# Patient Record
Sex: Male | Born: 1980 | Race: Black or African American | Hispanic: No | Marital: Single | Smoking: Never smoker
Health system: Southern US, Community
[De-identification: ages and names within clinical notes are randomized; demographics above are authoritative.]

## PROBLEM LIST (undated history)

## (undated) DIAGNOSIS — F3181 Bipolar II disorder: Secondary | ICD-10-CM

## (undated) DIAGNOSIS — K625 Hemorrhage of anus and rectum: Secondary | ICD-10-CM

## (undated) DIAGNOSIS — I10 Essential (primary) hypertension: Secondary | ICD-10-CM

## (undated) DIAGNOSIS — K635 Polyp of colon: Secondary | ICD-10-CM

## (undated) DIAGNOSIS — E785 Hyperlipidemia, unspecified: Secondary | ICD-10-CM

## (undated) DIAGNOSIS — N4889 Other specified disorders of penis: Secondary | ICD-10-CM

## (undated) HISTORY — PX: CIRCUMCISION: SUR203

---
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
## 2001-01-20 ENCOUNTER — Encounter: Admission: RE | Admit: 2001-01-20 | Discharge: 2001-01-20 | Payer: Self-pay | Admitting: Family Medicine

## 2001-04-08 ENCOUNTER — Encounter: Admission: RE | Admit: 2001-04-08 | Discharge: 2001-04-08 | Payer: Self-pay | Admitting: Family Medicine

## 2001-11-11 ENCOUNTER — Emergency Department (HOSPITAL_COMMUNITY): Admission: EM | Admit: 2001-11-11 | Discharge: 2001-11-11 | Payer: Self-pay | Admitting: Emergency Medicine

## 2001-11-16 ENCOUNTER — Encounter: Admission: RE | Admit: 2001-11-16 | Discharge: 2001-11-16 | Payer: Self-pay | Admitting: Family Medicine

## 2002-01-27 ENCOUNTER — Encounter: Admission: RE | Admit: 2002-01-27 | Discharge: 2002-01-27 | Payer: Self-pay | Admitting: Family Medicine

## 2002-04-12 ENCOUNTER — Encounter: Admission: RE | Admit: 2002-04-12 | Discharge: 2002-04-12 | Payer: Self-pay | Admitting: Family Medicine

## 2002-05-16 ENCOUNTER — Encounter: Admission: RE | Admit: 2002-05-16 | Discharge: 2002-05-16 | Payer: Self-pay | Admitting: Pediatrics

## 2002-06-17 ENCOUNTER — Emergency Department (HOSPITAL_COMMUNITY): Admission: EM | Admit: 2002-06-17 | Discharge: 2002-06-17 | Payer: Self-pay | Admitting: Emergency Medicine

## 2002-06-17 ENCOUNTER — Encounter: Payer: Self-pay | Admitting: Emergency Medicine

## 2003-02-03 ENCOUNTER — Encounter: Admission: RE | Admit: 2003-02-03 | Discharge: 2003-02-03 | Payer: Self-pay | Admitting: Family Medicine

## 2003-02-07 ENCOUNTER — Encounter: Admission: RE | Admit: 2003-02-07 | Discharge: 2003-02-07 | Payer: Self-pay | Admitting: Family Medicine

## 2003-02-09 ENCOUNTER — Encounter: Admission: RE | Admit: 2003-02-09 | Discharge: 2003-02-09 | Payer: Self-pay | Admitting: Family Medicine

## 2003-05-05 ENCOUNTER — Encounter: Admission: RE | Admit: 2003-05-05 | Discharge: 2003-05-05 | Payer: Self-pay | Admitting: Sports Medicine

## 2003-06-01 ENCOUNTER — Encounter: Admission: RE | Admit: 2003-06-01 | Discharge: 2003-06-01 | Payer: Self-pay | Admitting: Family Medicine

## 2004-02-26 ENCOUNTER — Encounter: Admission: RE | Admit: 2004-02-26 | Discharge: 2004-02-26 | Payer: Self-pay | Admitting: Family Medicine

## 2004-10-11 ENCOUNTER — Ambulatory Visit: Payer: Self-pay | Admitting: Family Medicine

## 2005-02-03 ENCOUNTER — Ambulatory Visit: Payer: Self-pay | Admitting: Family Medicine

## 2006-03-03 ENCOUNTER — Ambulatory Visit: Payer: Self-pay | Admitting: Family Medicine

## 2006-10-01 DIAGNOSIS — J309 Allergic rhinitis, unspecified: Secondary | ICD-10-CM | POA: Insufficient documentation

## 2006-10-01 DIAGNOSIS — F909 Attention-deficit hyperactivity disorder, unspecified type: Secondary | ICD-10-CM | POA: Insufficient documentation

## 2006-10-01 DIAGNOSIS — F79 Unspecified intellectual disabilities: Secondary | ICD-10-CM

## 2010-12-10 ENCOUNTER — Other Ambulatory Visit: Payer: Self-pay | Admitting: Nephrology

## 2010-12-10 ENCOUNTER — Ambulatory Visit
Admission: RE | Admit: 2010-12-10 | Discharge: 2010-12-10 | Disposition: A | Discharge status: Home / Self Care | Payer: Medicaid Other | Source: Ambulatory Visit | Attending: Nephrology | Admitting: Nephrology

## 2010-12-10 DIAGNOSIS — R05 Cough: Secondary | ICD-10-CM

## 2010-12-24 ENCOUNTER — Ambulatory Visit
Admission: RE | Admit: 2010-12-24 | Discharge: 2010-12-24 | Disposition: A | Discharge status: Home / Self Care | Payer: Medicaid Other | Source: Ambulatory Visit | Attending: Nephrology | Admitting: Nephrology

## 2010-12-24 ENCOUNTER — Other Ambulatory Visit: Payer: Self-pay | Admitting: Nephrology

## 2010-12-24 DIAGNOSIS — R05 Cough: Secondary | ICD-10-CM

## 2010-12-24 DIAGNOSIS — R059 Cough, unspecified: Secondary | ICD-10-CM

## 2011-08-05 HISTORY — PX: OTHER SURGICAL HISTORY: SHX169

## 2011-12-08 ENCOUNTER — Other Ambulatory Visit: Payer: Self-pay | Admitting: Urology

## 2011-12-12 ENCOUNTER — Encounter (HOSPITAL_BASED_OUTPATIENT_CLINIC_OR_DEPARTMENT_OTHER): Payer: Self-pay | Admitting: *Deleted

## 2011-12-12 NOTE — Progress Notes (Addendum)
SPOKE W/ PT MOTHER, TERRY. SHE IS LEGAL GUARDIAN, PT IS AUTISTIC AND BIPOLAR . MOTHER STATES THAT IF YOU ASK PT QUESTIONS, HE WILL ANSWER WITH "YES'S" AND HE IS NOT TO SIGN ANYTHING.  SHE SUGGEST THAT SHE BE WITH HIM ON ARRIVAL TO PRE-OP AND UPON WAKING IN PACU (MAY HAVE A BEHAVIOR ISSUE SINCE THINGS ARE UNFAMILIAR). NPO AFTER MN. ARRIVES AT 0730. NEEDS ISTAT.

## 2011-12-16 ENCOUNTER — Encounter (HOSPITAL_BASED_OUTPATIENT_CLINIC_OR_DEPARTMENT_OTHER): Payer: Self-pay | Admitting: *Deleted

## 2011-12-16 ENCOUNTER — Encounter (HOSPITAL_BASED_OUTPATIENT_CLINIC_OR_DEPARTMENT_OTHER): Payer: Self-pay | Admitting: Anesthesiology

## 2011-12-16 ENCOUNTER — Ambulatory Visit (HOSPITAL_BASED_OUTPATIENT_CLINIC_OR_DEPARTMENT_OTHER): Payer: Medicaid Other | Admitting: Anesthesiology

## 2011-12-16 ENCOUNTER — Encounter (HOSPITAL_BASED_OUTPATIENT_CLINIC_OR_DEPARTMENT_OTHER)
Admission: RE | Disposition: A | Discharge status: Home / Self Care | Payer: Self-pay | Source: Ambulatory Visit | Attending: Urology

## 2011-12-16 ENCOUNTER — Ambulatory Visit (HOSPITAL_BASED_OUTPATIENT_CLINIC_OR_DEPARTMENT_OTHER)
Admission: RE | Admit: 2011-12-16 | Discharge: 2011-12-16 | Disposition: A | Discharge status: Home / Self Care | Payer: Medicaid Other | Source: Ambulatory Visit | Attending: Urology | Admitting: Urology

## 2011-12-16 DIAGNOSIS — F840 Autistic disorder: Secondary | ICD-10-CM | POA: Insufficient documentation

## 2011-12-16 DIAGNOSIS — L723 Sebaceous cyst: Secondary | ICD-10-CM | POA: Insufficient documentation

## 2011-12-16 DIAGNOSIS — E7800 Pure hypercholesterolemia, unspecified: Secondary | ICD-10-CM | POA: Insufficient documentation

## 2011-12-16 DIAGNOSIS — I10 Essential (primary) hypertension: Secondary | ICD-10-CM | POA: Insufficient documentation

## 2011-12-16 DIAGNOSIS — Z79899 Other long term (current) drug therapy: Secondary | ICD-10-CM | POA: Insufficient documentation

## 2011-12-16 HISTORY — DX: Essential (primary) hypertension: I10

## 2011-12-16 HISTORY — DX: Hyperlipidemia, unspecified: E78.5

## 2011-12-16 HISTORY — DX: Bipolar II disorder: F31.81

## 2011-12-16 HISTORY — DX: Other specified disorders of penis: N48.89

## 2011-12-16 LAB — POCT I-STAT 4, (NA,K, GLUC, HGB,HCT)
Hemoglobin: 14.6 g/dL (ref 13.0–17.0)
Potassium: 4.1 mEq/L (ref 3.5–5.1)

## 2011-12-16 SURGERY — BIOPSY, PENIS
Anesthesia: General | Site: Penis | Wound class: Clean

## 2011-12-16 MED ORDER — MEPERIDINE HCL 25 MG/ML IJ SOLN: 6.2500 mg | INTRAMUSCULAR | Status: DC | PRN

## 2011-12-16 MED ORDER — LACTATED RINGERS IV SOLN: INTRAVENOUS | Status: DC

## 2011-12-16 MED ORDER — LIDOCAINE HCL (CARDIAC) 20 MG/ML IV SOLN
INTRAVENOUS | Status: DC | PRN
  Administered 2011-12-16: 80 mg via INTRAVENOUS

## 2011-12-16 MED ORDER — FENTANYL CITRATE 0.05 MG/ML IJ SOLN
INTRAMUSCULAR | Status: DC | PRN
  Administered 2011-12-16 (×2): 50 ug via INTRAVENOUS

## 2011-12-16 MED ORDER — DEXAMETHASONE SODIUM PHOSPHATE 4 MG/ML IJ SOLN
INTRAMUSCULAR | Status: DC | PRN
  Administered 2011-12-16: 8 mg via INTRAVENOUS

## 2011-12-16 MED ORDER — PROPOFOL 10 MG/ML IV EMUL
INTRAVENOUS | Status: DC | PRN
  Administered 2011-12-16: 200 mg via INTRAVENOUS

## 2011-12-16 MED ORDER — HYDROCODONE-ACETAMINOPHEN 5-500 MG PO CAPS: 1.0000 | ORAL_CAPSULE | Freq: Four times a day (QID) | ORAL | Status: AC | PRN

## 2011-12-16 MED ORDER — ATROPINE SULFATE 0.4 MG/ML IJ SOLN
INTRAMUSCULAR | Status: DC | PRN
  Administered 2011-12-16: 0.4 mg via INTRAVENOUS

## 2011-12-16 MED ORDER — MINERAL OIL LIGHT 100 % EX OIL
TOPICAL_OIL | CUTANEOUS | Status: DC | PRN
  Administered 2011-12-16: 1 via TOPICAL

## 2011-12-16 MED ORDER — LACTATED RINGERS IV SOLN
INTRAVENOUS | Status: DC
  Administered 2011-12-16: 10:00:00 via INTRAVENOUS
  Administered 2011-12-16: 100 mL/h via INTRAVENOUS

## 2011-12-16 MED ORDER — PROMETHAZINE HCL 25 MG/ML IJ SOLN: 6.2500 mg | INTRAMUSCULAR | Status: DC | PRN

## 2011-12-16 MED ORDER — LABETALOL HCL 5 MG/ML IV SOLN
5.0000 mg | INTRAVENOUS | Status: DC | PRN
  Administered 2011-12-16: 5 mg via INTRAVENOUS

## 2011-12-16 MED ORDER — ONDANSETRON HCL 4 MG/2ML IJ SOLN
INTRAMUSCULAR | Status: DC | PRN
  Administered 2011-12-16: 4 mg via INTRAVENOUS

## 2011-12-16 MED ORDER — FENTANYL CITRATE 0.05 MG/ML IJ SOLN: 25.0000 ug | INTRAMUSCULAR | Status: DC | PRN

## 2011-12-16 MED ORDER — BUPIVACAINE HCL (PF) 0.25 % IJ SOLN
INTRAMUSCULAR | Status: DC | PRN
  Administered 2011-12-16: 4 mL

## 2011-12-16 SURGICAL SUPPLY — 26 items
BANDAGE CO FLEX L/F 1IN X 5YD (GAUZE/BANDAGES/DRESSINGS) IMPLANT
BANDAGE CONFORM 2  STR LF (GAUZE/BANDAGES/DRESSINGS) ×1 IMPLANT
BLADE SURG 15 STRL LF DISP TIS (BLADE) ×1 IMPLANT
BLADE SURG 15 STRL SS (BLADE) ×2
BNDG COHESIVE 1X5 TAN STRL LF (GAUZE/BANDAGES/DRESSINGS) ×1 IMPLANT
CLOTH BEACON ORANGE TIMEOUT ST (SAFETY) ×2 IMPLANT
COVER MAYO STAND STRL (DRAPES) ×2 IMPLANT
COVER TABLE BACK 60X90 (DRAPES) ×2 IMPLANT
DRAPE PED LAPAROTOMY (DRAPES) ×2 IMPLANT
ELECT REM PT RETURN 9FT ADLT (ELECTROSURGICAL) ×2
ELECTRODE REM PT RTRN 9FT ADLT (ELECTROSURGICAL) ×1 IMPLANT
GAUZE VASELINE 1X8 (GAUZE/BANDAGES/DRESSINGS) ×1 IMPLANT
GLOVE BIO SURGEON STRL SZ7 (GLOVE) ×2 IMPLANT
GLOVE ECLIPSE 7.0 STRL STRAW (GLOVE) ×1 IMPLANT
GLOVE INDICATOR 7.0 STRL GRN (GLOVE) ×1 IMPLANT
GOWN PREVENTION PLUS LG XLONG (DISPOSABLE) ×2 IMPLANT
NDL HYPO 25X1 1.5 SAFETY (NEEDLE) ×1 IMPLANT
NEEDLE HYPO 25X1 1.5 SAFETY (NEEDLE) ×2 IMPLANT
NS IRRIG 500ML POUR BTL (IV SOLUTION) ×1 IMPLANT
PACK BASIN DAY SURGERY FS (CUSTOM PROCEDURE TRAY) ×2 IMPLANT
PENCIL BUTTON HOLSTER BLD 10FT (ELECTRODE) ×2 IMPLANT
SUT CHROMIC 4 0 P 3 18 (SUTURE) ×1 IMPLANT
SUT CHROMIC 5 0 RB 1 27 (SUTURE) IMPLANT
SYR CONTROL 10ML LL (SYRINGE) ×2 IMPLANT
TOWEL OR 17X24 6PK STRL BLUE (TOWEL DISPOSABLE) ×3 IMPLANT
TRAY DSU PREP LF (CUSTOM PROCEDURE TRAY) ×2 IMPLANT

## 2011-12-16 NOTE — H&P (Signed)
History of Present Illness  Matthew Gross is seen at Dr Janey Greaser request.  He was found on physical examination to have a penile cyst.  His mother noticed it about 2 months ago.  It is increasing in size.   It is not associated with any pain or discomfort.  He voids well.  He is autistic.   Past Medical History Problems  1. History of  Anxiety (Symptom) 300.00 2. History of  Depression 311 3. History of  Hypercholesterolemia 272.0 4. History of  Hypertension 401.9  Surgical History Problems  1. History of  Elective Circumcision V50.2  Current Meds 1. Lipitor 10 MG Oral Tablet; Therapy: (Recorded:06May2013) to 2. Mavik 2 MG Oral Tablet; Therapy: (Recorded:06May2013) to 3. Neurontin 800 MG Oral Tablet; Therapy: (Recorded:06May2013) to 4. RisperDAL M-TAB 1 MG Oral Tablet Dispersible; Therapy: (Recorded:06May2013) to 5. Wellbutrin XL 300 MG Oral Tablet Extended Release 24 Hour; Therapy: (Recorded:06May2013) to 6. Zoloft TABS; Therapy: (Recorded:06May2013) to  Allergies Medication  1. Penicillins  Family History Problems  1. Family history of  Bladder Cancer V16.52 2. Family history of  Blood In Urine 3. Maternal history of  Diabetes Mellitus V18.0 4. Family history of  Family Health Status - Mother's Age 80 5. Family history of  Father Deceased At Age ____ 6. Family history of  Prostate Cancer V16.42  Social History Problems  1. Marital History - Single 2. Never A Smoker Denied  3. History of  Alcohol Use 4. History of  Caffeine Use  Review of Systems Genitourinary, constitutional, skin, eye, otolaryngeal, hematologic/lymphatic, cardiovascular, pulmonary, endocrine, musculoskeletal, gastrointestinal, neurological and psychiatric system(s) were reviewed and pertinent findings if present are noted.  Genitourinary: nocturia.  Gastrointestinal: nausea and diarrhea.  Constitutional: fever.  Psychiatric: anxiety and depression.    Vitals Vital Signs [Data Includes: Last 1 Day]    06May2013 09:33AM  BMI Calculated: 27.3 BSA Calculated: 2.18 Height: 6 ft 1 in Weight: 206 lb  Blood Pressure: 142 / 82 Heart Rate: 84 Respiration: 18  Physical Exam Constitutional: Well nourished and well developed . No acute distress.  ENT:. The ears and nose are normal in appearance.  Neck: The appearance of the neck is normal and no neck mass is present.  Pulmonary: No respiratory distress and normal respiratory rhythm and effort.  Cardiovascular: Heart rate and rhythm are normal . No peripheral edema.  Abdomen: The abdomen is soft and nontender. No masses are palpated. No CVA tenderness. No hernias are palpable. No hepatosplenomegaly noted.  Genitourinary: Examination of the penis demonstrates swelling. The penis is circumcised.  There is a 2 cm mass on the right side of the penis proximal to the coronal sulcus.  The meatus is normal.  Lymphatics: The femoral and inguinal nodes are not enlarged or tender.  Skin: Normal skin turgor, no visible rash and no visible skin lesions.  Neuro/Psych:. Mood and affect are appropriate.    Results/Data Urine [Data Includes: Last 1 Day]   06May2013  COLOR YELLOW   APPEARANCE CLEAR   SPECIFIC GRAVITY 1.020   pH 5.5   GLUCOSE NEG mg/dL  BILIRUBIN NEG   KETONE NEG mg/dL  BLOOD NEG   PROTEIN NEG mg/dL  UROBILINOGEN 0.2 mg/dL  NITRITE NEG   LEUKOCYTE ESTERASE NEG    Assessment Assessed  1. Penile Lesion  Plan Health Maintenance (V70.0)  1. UA With REFLEX  Done: 06May2013 09:13AM   His mother would like to have it excised.  It needs to be done under general anesthesia.  The  procedure, risks, benefits were explained to his mother.  The risks include but are not limited to hemorrhage, infection, recurrence.  She understands and wishes to proceed.   Signatures  CC: Dr Jeri Cos  Amended By: Su Grand; 12/08/2011 9:48 AMEST  Electronically signed by : Su Grand, M.D.; Dec 08 2011  9:48AM

## 2011-12-16 NOTE — Op Note (Signed)
Matthew Gross is a 31 y.o.   12/16/2011  Pre-op diagnosis: Penile cysts  Postop diagnosis: Same  Procedure done: Excision biopsy of penile cysts  Surgeon: Wendie Simmer. Senica Crall  Anesthesia: General  Indication: Patient is a 31 years old autistic male who was found by his mother to have a penile cyst. He was sent to our office for evaluation and management by Dr. Bascom Levels. 2 inclusion cysts were found on the foreskin 1 cm proximal to the coronal sulcus. His mother requested that those cysts be excised. He is scheduled today for the procedure.  Procedure: Patient was identified by his wrist band and proper timeout was taken.  Under general anesthesia he was prepped and draped and placed in the supine position. The cysts were on the dorsal aspect of the penis. The penile skin was infiltrated with 0.25% Marcaine. An eleptical incision was made around the right cyst and the cyst was excised. Hemostasis was secured with electrocautery. Skin approximation was then done with #4-0 chromic. The same procedure was carried out on the other cyst. Sterile  dressing was then applied to the wound  EBL: Minimal  Needles, sponges count: Correct  The patient tolerated the procedure well and left the OR in satisfactory condition to postanesthesia care unit.

## 2011-12-16 NOTE — Transfer of Care (Signed)
Immediate Anesthesia Transfer of Care Note  Patient: Matthew Gross  Procedure(s) Performed: Procedure(s) (LRB): PENILE BIOPSY (N/A)  Patient Location: PACU  Anesthesia Type: General  Level of Consciousness: awake, oriented, sedated and patient cooperative  Airway & Oxygen Therapy: Patient Spontanous Breathing and Patient connected to face mask oxygen  Post-op Assessment: Report given to PACU RN and Post -op Vital signs reviewed and stable  Post vital signs: Reviewed and stable  Complications: No apparent anesthesia complications

## 2011-12-16 NOTE — Discharge Instructions (Signed)

## 2011-12-16 NOTE — Anesthesia Procedure Notes (Signed)
Procedure Name: LMA Insertion Date/Time: 12/16/2011 9:00 AM Performed by: Renella Cunas D Pre-anesthesia Checklist: Patient identified, Emergency Drugs available, Suction available and Patient being monitored Patient Re-evaluated:Patient Re-evaluated prior to inductionOxygen Delivery Method: Circle System Utilized Preoxygenation: Pre-oxygenation with 100% oxygen Intubation Type: IV induction Ventilation: Mask ventilation without difficulty LMA: LMA inserted LMA Size: 4.0 Number of attempts: 1 Airway Equipment and Method: bite block Placement Confirmation: positive ETCO2 Tube secured with: Tape Dental Injury: Teeth and Oropharynx as per pre-operative assessment

## 2011-12-16 NOTE — Anesthesia Preprocedure Evaluation (Addendum)
Anesthesia Evaluation  Patient identified by MRN, date of birth, ID band Patient awake    Reviewed: Allergy & Precautions, H&P , NPO status , Patient's Chart, lab work & pertinent test results  Airway Mallampati: II TM Distance: >3 FB Neck ROM: Full    Dental No notable dental hx.    Pulmonary neg pulmonary ROS,  breath sounds clear to auscultation  Pulmonary exam normal       Cardiovascular hypertension, negative cardio ROS  Rhythm:Regular Rate:Normal     Neuro/Psych PSYCHIATRIC DISORDERS Bipolar Disorder Mental retardation, autism. High functioningnegative neurological ROS  negative psych ROS   GI/Hepatic negative GI ROS, Neg liver ROS,   Endo/Other  negative endocrine ROS  Renal/GU negative Renal ROS  negative genitourinary   Musculoskeletal negative musculoskeletal ROS (+)   Abdominal   Peds negative pediatric ROS (+)  Hematology negative hematology ROS (+)   Anesthesia Other Findings   Reproductive/Obstetrics negative OB ROS                         Anesthesia Physical Anesthesia Plan  ASA: II  Anesthesia Plan: General   Post-op Pain Management:    Induction: Intravenous  Airway Management Planned: LMA  Additional Equipment:   Intra-op Plan:   Post-operative Plan: Extubation in OR  Informed Consent: I have reviewed the patients History and Physical, chart, labs and discussed the procedure including the risks, benefits and alternatives for the proposed anesthesia with the patient or authorized representative who has indicated his/her understanding and acceptance.   Dental advisory given  Plan Discussed with: CRNA  Anesthesia Plan Comments:         Anesthesia Quick Evaluation

## 2011-12-16 NOTE — Progress Notes (Signed)
Dr. Acey Lav at bedside.  States if mean >100 give 10mg  labetalol

## 2011-12-16 NOTE — Progress Notes (Signed)
bp elevated.  Hr 108.  Pt denies pain,  Dr. Acey Lav informed , at bedside.  Order received for labetalol

## 2011-12-16 NOTE — Anesthesia Postprocedure Evaluation (Signed)
  Anesthesia Post-op Note  Patient: Matthew Gross  Procedure(s) Performed: Procedure(s) (LRB): PENILE BIOPSY (N/A)  Patient Location: PACU  Anesthesia Type: General  Level of Consciousness: awake and alert   Airway and Oxygen Therapy: Patient Spontanous Breathing  Post-op Pain: mild  Post-op Assessment: Post-op Vital signs reviewed, Patient's Cardiovascular Status Stable, Respiratory Function Stable, Patent Airway and No signs of Nausea or vomiting  Post-op Vital Signs: stable  Complications: No apparent anesthesia complications

## 2011-12-23 ENCOUNTER — Encounter (HOSPITAL_BASED_OUTPATIENT_CLINIC_OR_DEPARTMENT_OTHER): Payer: Self-pay

## 2012-08-18 IMAGING — CR DG CHEST 2V
2 series · 2 of 2 positions shown · non-contrast
Comparison: None.

CLINICAL DATA: Cough, congestion for 2-week

CHEST - 2 VIEW

[view not recorded (1 of 2)]
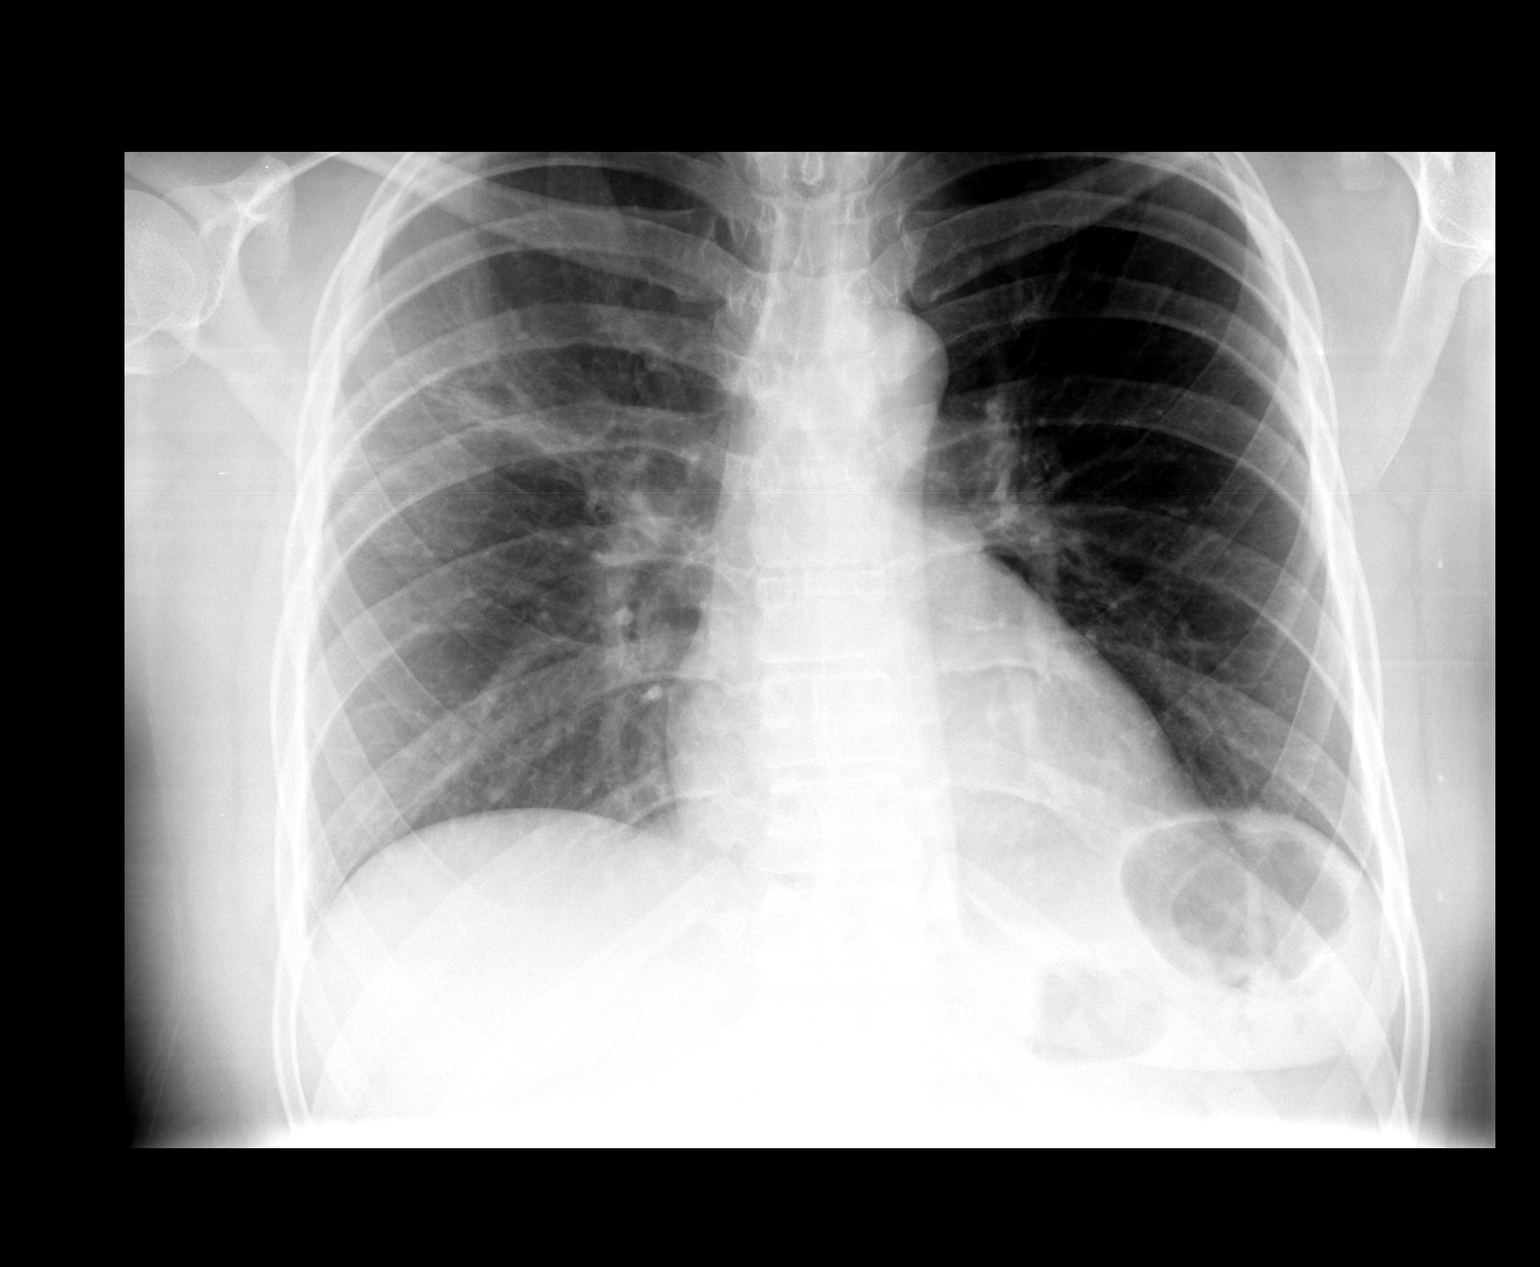

[view not recorded (2 of 2)]
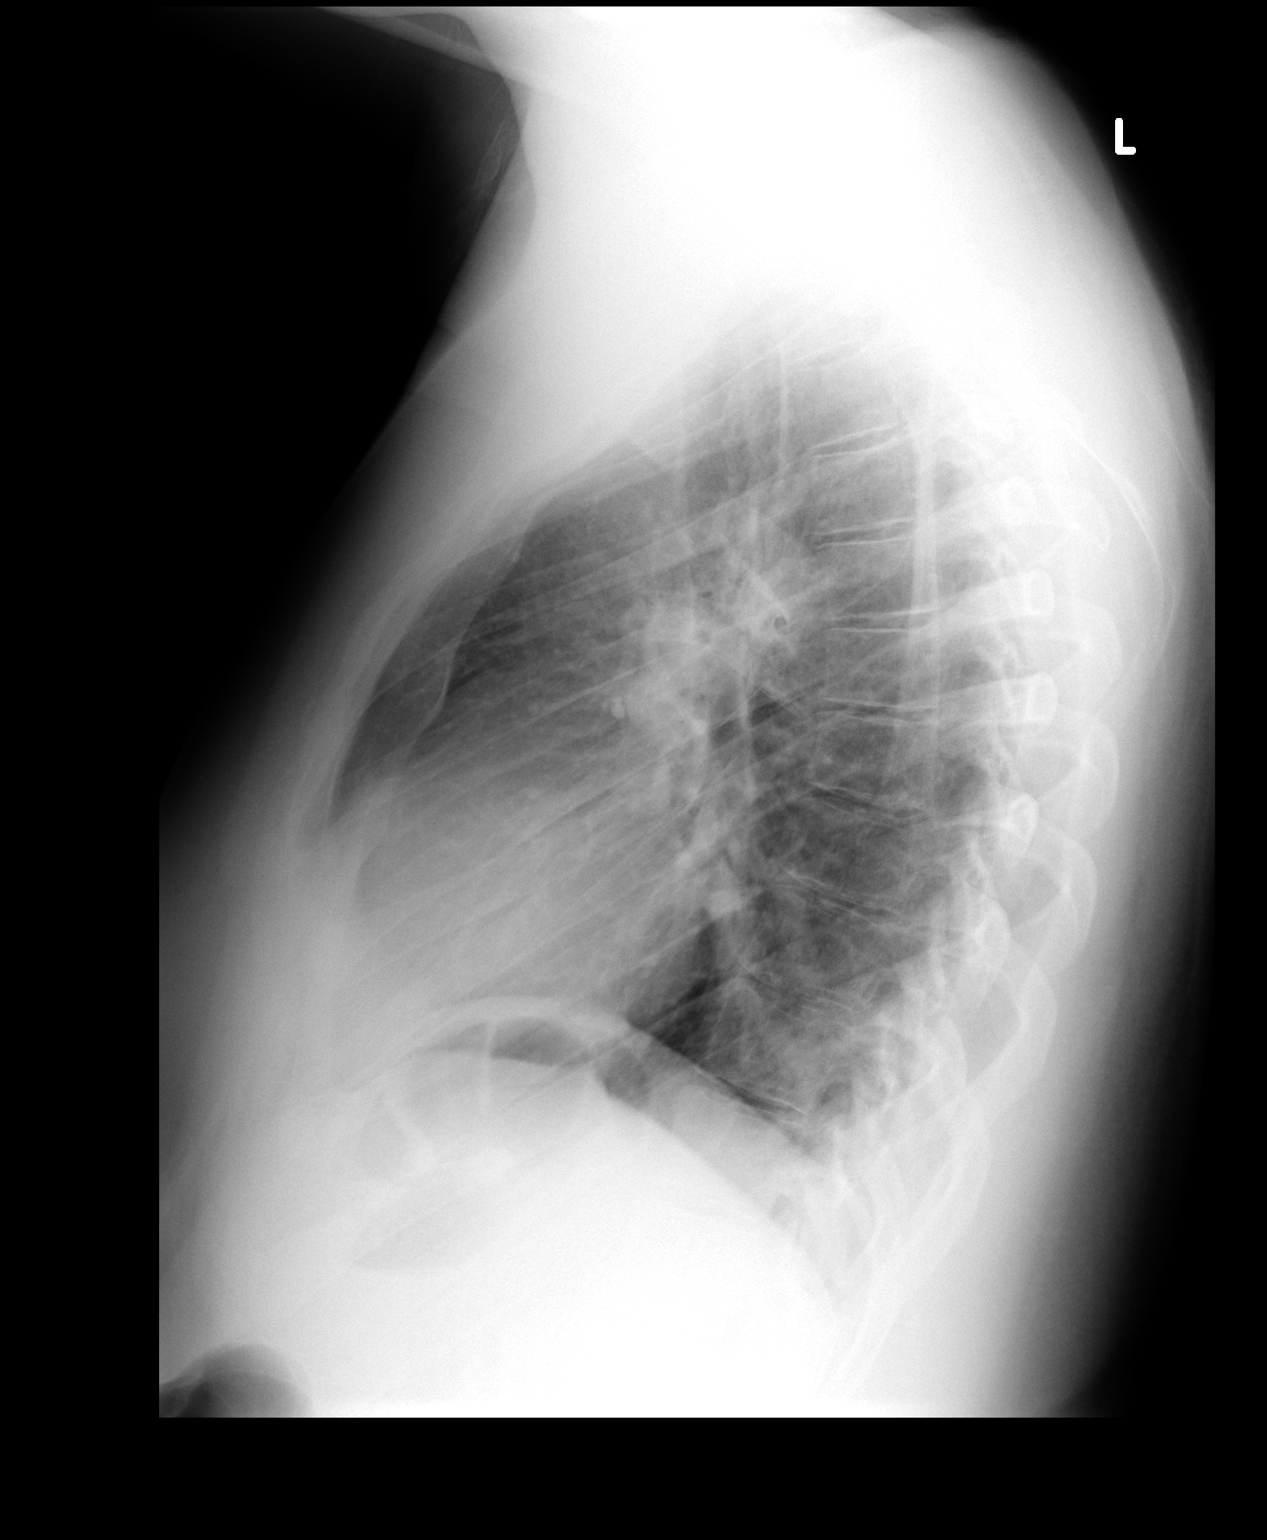

[2 of 2 positions shown; findings below may reference images not displayed]

FINDINGS: There is parenchymal opacity in the right mid upper lung
field which could represent atelectasis or patchy residual
pneumonia.  Peribronchial thickening is present consistent with
bronchitis.  Mediastinal contours are normal.  The heart is within
normal limits in size.  No bony abnormality is seen.
IMPRESSION: 1.  Parenchymal opacity in the right mid upper lung field may
represent atelectasis or residual pneumonia.
2.  Peribronchial thickening consistent with bronchitis.

## 2012-11-09 ENCOUNTER — Emergency Department (HOSPITAL_COMMUNITY)
Admission: EM | Admit: 2012-11-09 | Discharge: 2012-11-09 | Disposition: A | Discharge status: Home / Self Care | Payer: Medicaid Other | Source: Home / Self Care | Attending: Family Medicine | Admitting: Family Medicine

## 2012-11-09 ENCOUNTER — Encounter (HOSPITAL_COMMUNITY): Payer: Self-pay | Admitting: *Deleted

## 2012-11-09 DIAGNOSIS — E782 Mixed hyperlipidemia: Secondary | ICD-10-CM | POA: Diagnosis present

## 2012-11-09 DIAGNOSIS — I10 Essential (primary) hypertension: Secondary | ICD-10-CM | POA: Diagnosis present

## 2012-11-09 DIAGNOSIS — F3181 Bipolar II disorder: Secondary | ICD-10-CM | POA: Diagnosis present

## 2012-11-09 DIAGNOSIS — F840 Autistic disorder: Secondary | ICD-10-CM | POA: Diagnosis present

## 2012-11-09 DIAGNOSIS — E785 Hyperlipidemia, unspecified: Secondary | ICD-10-CM

## 2012-11-09 LAB — COMPREHENSIVE METABOLIC PANEL
ALT: 23 U/L (ref 0–53)
AST: 21 U/L (ref 0–37)
Albumin: 4.3 g/dL (ref 3.5–5.2)
CO2: 25 mEq/L (ref 19–32)
Calcium: 10.1 mg/dL (ref 8.4–10.5)
Creatinine, Ser: 1.05 mg/dL (ref 0.50–1.35)
GFR calc non Af Amer: >90 mL/min (ref >90)
Sodium: 140 mEq/L (ref 135–145)

## 2012-11-09 LAB — CBC
HCT: 41.9 % (ref 39.0–52.0)
Hemoglobin: 15.1 g/dL (ref 13.0–17.0)
MCH: 28.8 pg (ref 26.0–34.0)
MCHC: 36 g/dL (ref 30.0–36.0)
MCV: 79.8 fL (ref 78.0–100.0)
Platelets: 177 10*3/uL (ref 150–400)
RBC: 5.25 MIL/uL (ref 4.22–5.81)
RDW: 12.7 % (ref 11.5–15.5)
WBC: 6 10*3/uL (ref 4.0–10.5)

## 2012-11-09 LAB — LIPID PANEL
Cholesterol: 139 mg/dL (ref 0–200)
HDL: 56 mg/dL (ref >39)
LDL Cholesterol: 67 mg/dL (ref 0–99)
Total CHOL/HDL Ratio: 2.5 RATIO
Triglycerides: 79 mg/dL (ref <150)
VLDL: 16 mg/dL (ref 0–40)

## 2012-11-09 MED ORDER — TRANDOLAPRIL 2 MG PO TABS: 2.0000 mg | ORAL_TABLET | Freq: Every morning | ORAL | Status: DC

## 2012-11-09 MED ORDER — ATORVASTATIN CALCIUM 10 MG PO TABS: 10.0000 mg | ORAL_TABLET | Freq: Every day | ORAL | Status: DC

## 2012-11-09 NOTE — ED Notes (Signed)
Present to establish care with MD

## 2012-11-09 NOTE — ED Provider Notes (Signed)
History   CSN: [id number]  Arrival date & time 11/09/12  1030   First MD Initiated Contact with Patient 11/09/12 1052      No chief complaint on file.   (Consider location/radiation/quality/duration/timing/severity/associated sxs/prior treatment) HPI Pt presenting as new patient today for med refills.  He has been stable per mother and reports that he goes to behavioral health and they manage most of his medications.  Past Medical History  Diagnosis Date  . Depressed bipolar II disorder   . Autistic disorder PT GOES TO A DAY PROGRAM DAILY AND HAS ASSISTANCE AT HOME IN THE EVERY    PT MOTHER LEGAL GAUDERIAN  . Penile cyst   . Hypertension   . Hyperlipidemia     Past Surgical History  Procedure Laterality Date  . Circumcision  1990'S    No family history on file.  History  Substance Use Topics  . Smoking status: Never Smoker   . Smokeless tobacco: Never Used  . Alcohol Use: No    Review of Systems Constitutional: Negative.  HENT: Negative.  Respiratory: Negative.  Cardiovascular: Negative.  Gastrointestinal: Negative.  Endocrine: Negative.  Genitourinary: Negative.  Musculoskeletal: Negative.  Skin: Negative.  Allergic/Immunologic: Negative.  Neurological: Negative.  Hematological: Negative.  Psychiatric/Behavioral: Negative.  All other systems reviewed and are negative   Allergies  Penicillins  Home Medications   Current Outpatient Rx  Name  Route  Sig  Dispense  Refill  . atorvastatin (LIPITOR) 10 MG tablet   Oral   Take 10 mg by mouth every morning.         Marland Kitchen buPROPion (WELLBUTRIN XL) 300 MG 24 hr tablet   Oral   Take 300 mg by mouth every morning.         . Gabapentin (NEURONTIN PO)   Oral   Take 1,200 mg by mouth every evening. PT TAKES 1 1/2 OF 800 MG TABLET         . RisperiDONE (RISPERDAL PO)   Oral   Take 50 mg by mouth 2 (two) times daily. PT TAKES 1/2 100MG  TABLET         . Sertraline HCl (ZOLOFT PO)   Oral   Take 150 mg  by mouth every morning. PT TAKES 1 1/2 100MG  TABLET         . trandolapril (MAVIK) 2 MG tablet   Oral   Take 2 mg by mouth every morning.           BP 110/76  Pulse 68  Temp(Src) 98.4 F (36.9 C) (Oral)  Resp 18  SpO2 99%  Physical Exam Nursing note and vitals reviewed.  Constitutional: He is oriented to person, place, and time. He appears well-developed and well-nourished. No distress.  Eyes: Conjunctivae and EOM are normal. Pupils are equal, round, and reactive to light.  Neck: Normal range of motion. Neck supple. No JVD present. No thyromegaly present.  Cardiovascular: Normal rate, regular rhythm and normal heart sounds.  No murmur heard.  Pulmonary/Chest: Effort normal and breath sounds normal. No respiratory distress.  Abdominal: Soft. Bowel sounds are normal.  Musculoskeletal: Normal range of motion. He exhibits no edema.  Lymphadenopathy:  He has no cervical adenopathy.  Neurological: He is oriented to person, place, and time. Coordination normal.  Skin: Skin is warm and dry. No rash noted. No erythema. No pallor.  Psychiatric: He has a normal mood and affect. His behavior is normal. Judgment and thought content normal.    ED Course  Procedures (including critical  care time)  Labs Reviewed - No data to display No results found.   No diagnosis found.  MDM  IMPRESSION  Hypertension  Hyperlipidemia  Bipolar II disorder  Autistic Disorder  RECOMMENDATIONS / PLAN Refilled medications today Sent for old medical records Check labs today  FOLLOW UP 2 months   The patient was given clear instructions to go to ER or return to medical center if symptoms don't improve, worsen or new problems develop.  The patient verbalized understanding.  The patient was told to call to get lab results if they haven't heard anything in the next week.            Cleora Fleet, MD 11/09/12 1144

## 2012-11-10 ENCOUNTER — Telehealth (HOSPITAL_COMMUNITY): Payer: Self-pay

## 2012-11-10 LAB — VITAMIN D 25 HYDROXY (VIT D DEFICIENCY, FRACTURES): Vit D, 25-Hydroxy: 32 ng/mL (ref 30–89)

## 2012-11-10 NOTE — Progress Notes (Signed)
Quick Note:  Please inform guardian that all labs came back OK.   Rodney Langton, MD, CDE, FAAFP [facility] [place], [place]   ______

## 2013-02-07 ENCOUNTER — Encounter (HOSPITAL_COMMUNITY): Payer: Self-pay | Admitting: Emergency Medicine

## 2013-02-07 ENCOUNTER — Emergency Department (HOSPITAL_COMMUNITY)
Admission: EM | Admit: 2013-02-07 | Discharge: 2013-02-07 | Disposition: A | Discharge status: Home / Self Care | Payer: Medicaid Other | Source: Home / Self Care

## 2013-02-07 DIAGNOSIS — H9209 Otalgia, unspecified ear: Secondary | ICD-10-CM

## 2013-02-07 DIAGNOSIS — H9202 Otalgia, left ear: Secondary | ICD-10-CM

## 2013-02-07 DIAGNOSIS — J309 Allergic rhinitis, unspecified: Secondary | ICD-10-CM

## 2013-02-07 MED ORDER — FLUTICASONE PROPIONATE 50 MCG/ACT NA SUSP: 2.0000 | Freq: Every day | NASAL | Status: DC

## 2013-02-07 MED ORDER — FEXOFENADINE-PSEUDOEPHED ER 60-120 MG PO TB12: 1.0000 | ORAL_TABLET | Freq: Two times a day (BID) | ORAL | Status: DC

## 2013-02-07 MED ORDER — NEOMYCIN-POLYMYXIN-HC 3.5-10000-1 OT SUSP: 4.0000 [drp] | Freq: Three times a day (TID) | OTIC | Status: DC

## 2013-02-07 NOTE — ED Notes (Signed)
Pts legal guardian c/o left ear pain since last night. Sudden onset. Denies fever or drainage. Patient is alert and oriented.

## 2013-02-07 NOTE — ED Provider Notes (Signed)
Medical screening examination/treatment/procedure(s) were performed by non-physician practitioner and as supervising physician I was immediately available for consultation/collaboration.  Leslee Home, M.D.   Reuben Likes, MD 02/07/13 [phone]

## 2013-02-07 NOTE — ED Provider Notes (Signed)
History    CSN: [id number] Arrival date & time 02/07/13  0947  None    Chief Complaint  Patient presents with  . Otalgia   (Consider location/radiation/quality/duration/timing/severity/associated sxs/prior Treatment) HPI  32 yo male presents today with the above complaint.  Hx obtained from legal guardian.  Left ear pain started yesterday, but he has been having some problems with runny nose, nasal congestion, itchy/watery eyes for 2-3 days.  No complaints of fever, chills, headaches, cough, cp, sob, sore throat.  No trouble with hearing.   Past Medical History  Diagnosis Date  . Depressed bipolar II disorder   . Autistic disorder PT GOES TO A DAY PROGRAM DAILY AND HAS ASSISTANCE AT HOME IN THE EVERY    PT MOTHER LEGAL GAUDERIAN  . Penile cyst   . Hypertension   . Hyperlipidemia    Past Surgical History  Procedure Laterality Date  . Circumcision  1990'S  . Cyst removal from penis  2013    Dr. Brunilda Payor    Family History  Problem Relation Age of Onset  . Heart failure Mother   . Hypertension Mother   . Hyperlipidemia Mother    History  Substance Use Topics  . Smoking status: Never Smoker   . Smokeless tobacco: Never Used  . Alcohol Use: No    Review of Systems  Constitutional: Negative.   HENT: Positive for ear pain and congestion. Negative for hearing loss and ear discharge.   Eyes: Positive for itching. Negative for photophobia, redness and visual disturbance.  Respiratory: Negative.   Cardiovascular: Negative.   Gastrointestinal: Negative.   Endocrine: Negative.   Genitourinary: Negative.   Neurological: Negative.   Psychiatric/Behavioral: Negative.     Allergies  Penicillins  Home Medications   Current Outpatient Rx  Name  Route  Sig  Dispense  Refill  . atorvastatin (LIPITOR) 10 MG tablet   Oral   Take 1 tablet (10 mg total) by mouth daily.   30 tablet   3   . buPROPion (WELLBUTRIN XL) 300 MG 24 hr tablet   Oral   Take 300 mg by mouth every  morning.         . Gabapentin (NEURONTIN PO)   Oral   Take 1,200 mg by mouth every evening. PT TAKES 1 1/2 OF 800 MG TABLET         . RisperiDONE (RISPERDAL PO)   Oral   Take 50 mg by mouth 2 (two) times daily. PT TAKES 1/2 100MG  TABLET         . Sertraline HCl (ZOLOFT PO)   Oral   Take 150 mg by mouth every morning. PT TAKES 1 1/2 100MG  TABLET         . trandolapril (MAVIK) 2 MG tablet   Oral   Take 1 tablet (2 mg total) by mouth every morning.   30 tablet   3   . fexofenadine-pseudoephedrine (ALLEGRA-D) 60-120 MG per tablet   Oral   Take 1 tablet by mouth every 12 (twelve) hours.   30 tablet   0   . fluticasone (FLONASE) 50 MCG/ACT nasal spray   Nasal   Place 2 sprays into the nose daily.   16 g   2   . neomycin-polymyxin-hydrocortisone (CORTISPORIN) 3.5-10000-1 otic suspension   Left Ear   Place 4 drops into the left ear 3 (three) times daily.   10 mL   1    BP 135/86  Pulse 75  Temp(Src) 98.8 F (37.1 C) (  Oral)  Resp 14  SpO2 97% Physical Exam  Constitutional: He appears well-developed and well-nourished.  HENT:  Head: Normocephalic and atraumatic.  Right Ear: External ear normal.  Nose: Nose normal.  Mouth/Throat: Oropharynx is clear and moist. No oropharyngeal exudate.  Left ear canal very tender with otoscope exam.  TM looks good.  Canal is red with some swelling.  No drainage.    Neck: Normal range of motion.  Cardiovascular: Normal rate and regular rhythm.   Pulmonary/Chest: Effort normal and breath sounds normal.  Abdominal: Soft.  Musculoskeletal: Normal range of motion.  Lymphadenopathy:    He has no cervical adenopathy.  Neurological: He is alert.  Skin: Skin is warm and dry.    ED Course  Procedures (including critical care time) Labs Reviewed - No data to display No results found. 1. Otalgia of left ear   2. Allergic rhinitis     MDM  Patient and legal guardian advised to return to clinic in 3-5 days if symptoms not  resolved or sooner if needed.  Voice understanding.  Went over instructions  Meds ordered this encounter  Medications  . fexofenadine-pseudoephedrine (ALLEGRA-D) 60-120 MG per tablet    Sig: Take 1 tablet by mouth every 12 (twelve) hours.    Dispense:  30 tablet    Refill:  0  . fluticasone (FLONASE) 50 MCG/ACT nasal spray    Sig: Place 2 sprays into the nose daily.    Dispense:  16 g    Refill:  2  . neomycin-polymyxin-hydrocortisone (CORTISPORIN) 3.5-10000-1 otic suspension    Sig: Place 4 drops into the left ear 3 (three) times daily.    Dispense:  10 mL    Refill:  1    Zonia Kief, PA-C 02/07/13 1056

## 2013-02-07 NOTE — Discharge Instructions (Signed)
Allergic Rhinitis Allergic rhinitis is when the mucous membranes in the nose respond to allergens. Allergens are particles in the air that cause your body to have an allergic reaction. This causes you to release allergic antibodies. Through a chain of events, these eventually cause you to release histamine into the blood stream (hence the use of antihistamines). Although meant to be protective to the body, it is this release that causes your discomfort, such as frequent sneezing, congestion and an itchy runny nose.  CAUSES  The pollen allergens may come from grasses, trees, and weeds. This is seasonal allergic rhinitis, or "hay fever." Other allergens cause year-round allergic rhinitis (perennial allergic rhinitis) such as house dust mite allergen, pet dander and mold spores.  SYMPTOMS   Nasal stuffiness (congestion).  Runny, itchy nose with sneezing and tearing of the eyes.  There is often an itching of the mouth, eyes and ears. It cannot be cured, but it can be controlled with medications. DIAGNOSIS  If you are unable to determine the offending allergen, skin or blood testing may find it. TREATMENT   Avoid the allergen.  Medications and allergy shots (immunotherapy) can help.  Hay fever may often be treated with antihistamines in pill or nasal spray forms. Antihistamines block the effects of histamine. There are over-the-counter medicines that may help with nasal congestion and swelling around the eyes. Check with your caregiver before taking or giving this medicine. If the treatment above does not work, there are many new medications your caregiver can prescribe. Stronger medications may be used if initial measures are ineffective. Desensitizing injections can be used if medications and avoidance fails. Desensitization is when a patient is given ongoing shots until the body becomes less sensitive to the allergen. Make sure you follow up with your caregiver if problems continue. SEEK MEDICAL  CARE IF:   You develop fever (more than 100.5 F (38.1 C).  You develop a cough that does not stop easily (persistent).  You have shortness of breath.  You start wheezing.  Symptoms interfere with normal daily activities. Document Released: 04/15/2001 Document Revised: 10/13/2011 Document Reviewed: 10/25/2008 [facility] Patient Information 2014 [place], [place].  Otalgia Otalgia is pain in or around the ear. When the pain is from the ear itself it is called primary otalgia. Pain may also be coming from somewhere else, like the head and neck. This is called secondary otalgia.  CAUSES  Causes of primary otalgia include:  Middle ear infection.  It can also be caused by injury to the ear or infection of the ear canal (swimmer's ear). Swimmer's ear causes pain, swelling and often drainage from the ear canal. Causes of secondary otalgia include:  Sinus infections.  Allergies and colds that cause stuffiness of the nose and tubes that drain the ears (eustachian tubes).  Dental problems like cavities, gum infections or teething.  Sore Throat (tonsillitis and pharyngitis).  Swollen glands in the neck.  Infection of the bone behind the ear (mastoiditis).  TMJ discomfort (problems with the joint between your jaw and your skull).  Other problems such as nerve disorders, circulation problems, heart disease and tumors of the head and neck can also cause symptoms of ear pain. This is rare. DIAGNOSIS  Evaluation, Diagnosis and Testing:  Examination by your medical caregiver is recommended to evaluate and diagnose the cause of otalgia.  Further testing or referral to a specialist may be indicated if the cause of the ear pain is not found and the symptom persists. TREATMENT   Your  doctor may prescribe antibiotics if an ear infection is diagnosed.  Pain relievers and topical analgesics may be recommended.  It is important to take all medications as prescribed. HOME CARE INSTRUCTIONS    It may be helpful to sleep with the painful ear in the up position.  A warm compress over the painful ear may provide relief.  A soft diet and avoiding gum may help while ear pain is present. SEEK IMMEDIATE MEDICAL CARE IF:  You develop severe pain, a high fever, repeated vomiting or dehydration.  You develop extreme dizziness, headache, confusion, ringing in the ears (tinnitus) or hearing loss. Document Released: 08/28/2004 Document Revised: 10/13/2011 Document Reviewed: 05/30/2009 [facility] Patient Information 2014 [place], [place].

## 2014-10-26 ENCOUNTER — Ambulatory Visit
Admission: RE | Admit: 2014-10-26 | Discharge: 2014-10-26 | Disposition: A | Discharge status: Home / Self Care | Payer: Medicaid Other | Source: Ambulatory Visit | Attending: Family | Admitting: Family

## 2014-10-26 ENCOUNTER — Other Ambulatory Visit: Payer: Self-pay | Admitting: Family

## 2014-10-26 DIAGNOSIS — R05 Cough: Secondary | ICD-10-CM

## 2014-10-26 DIAGNOSIS — R059 Cough, unspecified: Secondary | ICD-10-CM

## 2015-08-05 DIAGNOSIS — K625 Hemorrhage of anus and rectum: Secondary | ICD-10-CM

## 2015-08-05 HISTORY — DX: Hemorrhage of anus and rectum: K62.5

## 2015-08-05 HISTORY — PX: OTHER SURGICAL HISTORY: SHX169

## 2015-11-27 ENCOUNTER — Encounter (HOSPITAL_COMMUNITY): Payer: Self-pay | Admitting: Emergency Medicine

## 2015-11-27 ENCOUNTER — Emergency Department (HOSPITAL_COMMUNITY): Payer: Medicaid Other

## 2015-11-27 ENCOUNTER — Emergency Department (HOSPITAL_COMMUNITY)
Admission: EM | Admit: 2015-11-27 | Discharge: 2015-11-27 | Disposition: A | Discharge status: Home / Self Care | Payer: Medicaid Other | Attending: Emergency Medicine | Admitting: Emergency Medicine

## 2015-11-27 DIAGNOSIS — Z880 Allergy status to penicillin: Secondary | ICD-10-CM | POA: Insufficient documentation

## 2015-11-27 DIAGNOSIS — Z79899 Other long term (current) drug therapy: Secondary | ICD-10-CM | POA: Insufficient documentation

## 2015-11-27 DIAGNOSIS — R05 Cough: Secondary | ICD-10-CM | POA: Insufficient documentation

## 2015-11-27 DIAGNOSIS — F840 Autistic disorder: Secondary | ICD-10-CM | POA: Diagnosis not present

## 2015-11-27 DIAGNOSIS — E785 Hyperlipidemia, unspecified: Secondary | ICD-10-CM | POA: Diagnosis not present

## 2015-11-27 DIAGNOSIS — F3181 Bipolar II disorder: Secondary | ICD-10-CM | POA: Insufficient documentation

## 2015-11-27 DIAGNOSIS — I10 Essential (primary) hypertension: Secondary | ICD-10-CM | POA: Diagnosis not present

## 2015-11-27 DIAGNOSIS — Z7951 Long term (current) use of inhaled steroids: Secondary | ICD-10-CM | POA: Insufficient documentation

## 2015-11-27 DIAGNOSIS — R059 Cough, unspecified: Secondary | ICD-10-CM

## 2015-11-27 MED ORDER — BENZONATATE 100 MG PO CAPS: 100.0000 mg | ORAL_CAPSULE | Freq: Three times a day (TID) | ORAL | Status: DC

## 2015-11-27 MED ORDER — BENZONATATE 100 MG PO CAPS
100.0000 mg | ORAL_CAPSULE | Freq: Once | ORAL | Status: AC
  Administered 2015-11-27: 100 mg via ORAL
  Filled 2015-11-27: qty 1

## 2015-11-27 NOTE — ED Notes (Signed)
PT IS AUTISTIC/BIPOLAR  Mother reports pt has had productive cough x 2 days. Thick, yellow sputum. States he goes to a daycare and 'picks up everything'.

## 2015-11-27 NOTE — Discharge Instructions (Signed)
1. Medications: tessalon, usual home medications 2. Treatment: rest, drink plenty of fluids; try warm honey, tea, throat lozenges for additional symptom relief 3. Follow Up: please followup with your primary doctor for discussion of your diagnoses and further evaluation after today's visit; if you do not have a primary care doctor use the phone number listed in your discharge paperwork to find one; please return to the ER for high fever, chest pain, shortness of breath, new or worsening symptoms   Cough, Adult A cough helps to clear your throat and lungs. A cough may last only 2-3 weeks (acute), or it may last longer than 8 weeks (chronic). Many different things can cause a cough. A cough may be a sign of an illness or another medical condition. HOME CARE  Pay attention to any changes in your cough.  Take medicines only as told by your doctor.  If you were prescribed an antibiotic medicine, take it as told by your doctor. Do not stop taking it even if you start to feel better.  Talk with your doctor before you try using a cough medicine.  Drink enough fluid to keep your pee (urine) clear or pale yellow.  If the air is dry, use a cold steam vaporizer or humidifier in your home.  Stay away from things that make you cough at work or at home.  If your cough is worse at night, try using extra pillows to raise your head up higher while you sleep.  Do not smoke, and try not to be around smoke. If you need help quitting, ask your doctor.  Do not have caffeine.  Do not drink alcohol.  Rest as needed. GET HELP IF:  You have new problems (symptoms).  You cough up yellow fluid (pus).  Your cough does not get better after 2-3 weeks, or your cough gets worse.  Medicine does not help your cough and you are not sleeping well.  You have pain that gets worse or pain that is not helped with medicine.  You have a fever.  You are losing weight and you do not know why.  You have night  sweats. GET HELP RIGHT AWAY IF:  You cough up blood.  You have trouble breathing.  Your heartbeat is very fast.   This information is not intended to replace advice given to you by your health care provider. Make sure you discuss any questions you have with your health care provider.   Document Released: 04/03/2011 Document Revised: 04/11/2015 Document Reviewed: 09/27/2014 Elsevier Interactive Patient Education [employer].

## 2015-11-27 NOTE — ED Provider Notes (Signed)
CSN: [id number]     Arrival date & time 11/27/15  [id number] History   First MD Initiated Contact with Patient 11/27/15 [phone]     No chief complaint on file.   HPI   Matthew Gross is a 35 y.o. male with a PMH of autism, depression, HTN, HLD who presents to the ED with cough productive of yellow green mucous for the past 2 days. He denies exacerbating factors. He has been taking over-the-counter cough medicine with no significant symptom relief. Mom is present at bedside, and reports fever at home. The patient denies chest pain, shortness of breath, sick contact, though notes he is in a daycare program.   Past Medical History  Diagnosis Date  . Depressed bipolar II disorder ([place])   . Autistic disorder PT GOES TO A DAY PROGRAM DAILY AND HAS ASSISTANCE AT HOME IN THE EVERY    PT MOTHER LEGAL [place]  . Penile cyst   . Hypertension   . Hyperlipidemia    Past Surgical History  Procedure Laterality Date  . Circumcision  1990'S  . Cyst removal from penis  2013    Dr. Janice Norrie    Family History  Problem Relation Age of Onset  . Heart failure Mother   . Hypertension Mother   . Hyperlipidemia Mother    Social History  Substance Use Topics  . Smoking status: Never Smoker   . Smokeless tobacco: Never Used  . Alcohol Use: No      Review of Systems  Constitutional: Positive for fever. Negative for chills.  HENT: Negative for congestion and sore throat.   Respiratory: Positive for cough. Negative for shortness of breath.   Cardiovascular: Negative for chest pain.  All other systems reviewed and are negative.     Allergies  Penicillins  Home Medications   Prior to Admission medications   Medication Sig Start Date End Date Taking? Authorizing Provider  atorvastatin (LIPITOR) 10 MG tablet Take 1 tablet (10 mg total) by mouth daily. 11/09/12   Clanford Marisa Hua, MD  benzonatate (TESSALON) 100 MG capsule Take 1 capsule (100 mg total) by mouth every 8 (eight) hours. 11/27/15   Marella Chimes, PA-C  buPROPion (WELLBUTRIN XL) 300 MG 24 hr tablet Take 300 mg by mouth every morning.    Historical Provider, MD  fexofenadine-pseudoephedrine (ALLEGRA-D) 60-120 MG per tablet Take 1 tablet by mouth every 12 (twelve) hours. 02/07/13   Lanae Crumbly, PA-C  fluticasone (FLONASE) 50 MCG/ACT nasal spray Place 2 sprays into the nose daily. 02/07/13   Lanae Crumbly, PA-C  Gabapentin (NEURONTIN PO) Take 1,200 mg by mouth every evening. PT TAKES 1 1/2 OF 800 MG TABLET    Historical Provider, MD  neomycin-polymyxin-hydrocortisone (CORTISPORIN) 3.5-10000-1 otic suspension Place 4 drops into the left ear 3 (three) times daily. 02/07/13   Lanae Crumbly, PA-C  RisperiDONE (RISPERDAL PO) Take 50 mg by mouth 2 (two) times daily. PT TAKES 1/2 100MG  TABLET    Historical Provider, MD  Sertraline HCl (ZOLOFT PO) Take 150 mg by mouth every morning. PT TAKES 1 1/2 100MG  TABLET    Historical Provider, MD  trandolapril (MAVIK) 2 MG tablet Take 1 tablet (2 mg total) by mouth every morning. 11/09/12   Clanford L Johnson, MD    BP 137/82 mmHg  Pulse 74  Temp(Src) 98.6 F (37 C) (Oral)  Resp 14  SpO2 98% Physical Exam  Constitutional: He is oriented to person, place, and time. He appears well-developed and well-nourished.  No distress.  HENT:  Head: Normocephalic and atraumatic.  Right Ear: Hearing, tympanic membrane, external ear and ear canal normal.  Left Ear: Hearing, tympanic membrane, external ear and ear canal normal.  Nose: Nose normal.  Mouth/Throat: Uvula is midline, oropharynx is clear and moist and mucous membranes are normal.  Eyes: Conjunctivae, EOM and lids are normal. Pupils are equal, round, and reactive to light. Right eye exhibits no discharge. Left eye exhibits no discharge. No scleral icterus.  Neck: Normal range of motion. Neck supple.  Cardiovascular: Normal rate, regular rhythm, normal heart sounds, intact distal pulses and normal pulses.   Pulmonary/Chest: Effort normal and breath sounds  normal. No respiratory distress. He has no wheezes. He has no rales.  Abdominal: Soft. Normal appearance and bowel sounds are normal. He exhibits no distension and no mass. There is no tenderness. There is no rigidity, no rebound and no guarding.  Musculoskeletal: Normal range of motion. He exhibits no edema or tenderness.  Neurological: He is alert and oriented to person, place, and time.  Skin: Skin is warm, dry and intact. No rash noted. He is not diaphoretic. No erythema. No pallor.  Psychiatric: He has a normal mood and affect. His speech is normal and behavior is normal.  Nursing note and vitals reviewed.   ED Course  Procedures (including critical care time)  Labs Review Labs Reviewed - No data to display  Imaging Review Dg Chest 2 View  11/27/2015  CLINICAL DATA:  Upper respiratory infection symptoms for the past 3 days ; history of pneumonia in 2016, nonsmoker. EXAM: CHEST  2 VIEW COMPARISON:  PA and lateral chest x-ray of October 26, 2014 FINDINGS: The lungs are adequately inflated and clear. The heart and pulmonary vascularity are normal. The mediastinum is normal in width. There is no pleural effusion. The bony thorax is unremarkable. IMPRESSION: There is no active cardiopulmonary disease. Electronically Signed   By: David  Martinique M.D.   On: 11/27/2015 10:13   I have personally reviewed and evaluated these images as part of my medical decision-making.   EKG Interpretation None      MDM   Final diagnoses:  Cough    35 year old male presents with cough productive of thick yellow sputum for the past 2 days. Mom reports fever at home. Patient denies chest pain or shortness of breath. Patient is afebrile. Vital signs stable. Posterior oropharynx without erythema, edema, or exudate. Heart regular rate and rhythm. Lungs clear to auscultation bilaterally.   Will obtain chest x-ray and give cough medicine.   Chest x-ray negative for active cardiopulmonary disease. Discussed  findings with patient and mom. Patient is nontoxic and well-appearing, feel he is stable for discharge at this time. Symptoms likely viral. Will give cough medicine for home. Patient to follow-up with PCP. Strict return precautions discussed. Patient and mom verbalize their understanding and are in agreement with plan.  BP 137/82 mmHg  Pulse 74  Temp(Src) 98.6 F (37 C) (Oral)  Resp 14  SpO2 98%     Marella Chimes, PA-C 11/27/15 1023  Virgel Manifold, MD 12/05/15 1244

## 2015-12-05 ENCOUNTER — Other Ambulatory Visit: Payer: Self-pay | Admitting: Gastroenterology

## 2015-12-21 ENCOUNTER — Emergency Department (HOSPITAL_COMMUNITY)
Admission: EM | Admit: 2015-12-21 | Discharge: 2015-12-21 | Disposition: A | Discharge status: Home / Self Care | Payer: Medicaid Other | Attending: Emergency Medicine | Admitting: Emergency Medicine

## 2015-12-21 ENCOUNTER — Encounter (HOSPITAL_COMMUNITY): Payer: Self-pay | Admitting: Emergency Medicine

## 2015-12-21 DIAGNOSIS — Z87438 Personal history of other diseases of male genital organs: Secondary | ICD-10-CM | POA: Insufficient documentation

## 2015-12-21 DIAGNOSIS — F840 Autistic disorder: Secondary | ICD-10-CM | POA: Diagnosis not present

## 2015-12-21 DIAGNOSIS — Z880 Allergy status to penicillin: Secondary | ICD-10-CM | POA: Insufficient documentation

## 2015-12-21 DIAGNOSIS — F3181 Bipolar II disorder: Secondary | ICD-10-CM | POA: Diagnosis not present

## 2015-12-21 DIAGNOSIS — I10 Essential (primary) hypertension: Secondary | ICD-10-CM | POA: Diagnosis not present

## 2015-12-21 DIAGNOSIS — Z79899 Other long term (current) drug therapy: Secondary | ICD-10-CM | POA: Insufficient documentation

## 2015-12-21 DIAGNOSIS — E785 Hyperlipidemia, unspecified: Secondary | ICD-10-CM | POA: Diagnosis not present

## 2015-12-21 DIAGNOSIS — K921 Melena: Secondary | ICD-10-CM | POA: Insufficient documentation

## 2015-12-21 DIAGNOSIS — K625 Hemorrhage of anus and rectum: Secondary | ICD-10-CM | POA: Diagnosis present

## 2015-12-21 LAB — CBC
HCT: 39.6 % (ref 39.0–52.0)
Hemoglobin: 13.4 g/dL (ref 13.0–17.0)
MCH: 28.2 pg (ref 26.0–34.0)
MCHC: 33.8 g/dL (ref 30.0–36.0)
MCV: 83.4 fL (ref 78.0–100.0)
PLATELETS: 187 10*3/uL (ref 150–400)
RBC: 4.75 MIL/uL (ref 4.22–5.81)
RDW: 12.9 % (ref 11.5–15.5)
WBC: 5.5 10*3/uL (ref 4.0–10.5)

## 2015-12-21 LAB — COMPREHENSIVE METABOLIC PANEL
ALK PHOS: 62 U/L (ref 38–126)
ALT: 32 U/L (ref 17–63)
AST: 25 U/L (ref 15–41)
Albumin: 3.9 g/dL (ref 3.5–5.0)
Anion gap: 5 (ref 5–15)
BILIRUBIN TOTAL: 0.7 mg/dL (ref 0.3–1.2)
BUN: 7 mg/dL (ref 6–20)
CALCIUM: 9.2 mg/dL (ref 8.9–10.3)
CO2: 27 mmol/L (ref 22–32)
CREATININE: 1.19 mg/dL (ref 0.61–1.24)
Chloride: 105 mmol/L (ref 101–111)
GFR calc Af Amer: >60 mL/min (ref >60)
Glucose, Bld: 93 mg/dL (ref 65–99)
Potassium: 3.9 mmol/L (ref 3.5–5.1)
Sodium: 137 mmol/L (ref 135–145)
TOTAL PROTEIN: 7.1 g/dL (ref 6.5–8.1)

## 2015-12-21 LAB — TYPE AND SCREEN
ABO/RH(D): O POS
ANTIBODY SCREEN: NEGATIVE

## 2015-12-21 LAB — POC OCCULT BLOOD, ED: Fecal Occult Bld: POSITIVE — AB

## 2015-12-21 LAB — ABO/RH: ABO/RH(D): O POS

## 2015-12-21 NOTE — ED Provider Notes (Signed)
CSN: [id number]     Arrival date & time 12/21/15  1312 History   First MD Initiated Contact with Patient 12/21/15 1551     Chief Complaint  Patient presents with  . Rectal Bleeding     (Consider location/radiation/quality/duration/timing/severity/associated sxs/prior Treatment) Patient is a 35 y.o. male presenting with hematochezia. The history is provided by the patient, a parent and medical records. No language interpreter was used.  Rectal Bleeding Quality:  Bright red Amount:  Moderate Duration:  1 day Timing:  Intermittent Progression:  Partially resolved Context comment:  S/p colonoscopy for rectal bleeding 12/05/15, per family benign poly was removed. Patient had 2 episodes of formed stool mixed with BRB today Similar prior episodes: yes   Relieved by:  None tried Worsened by:  Defecation Ineffective treatments:  None tried Associated symptoms: no abdominal pain, no dizziness, no fever, no hematemesis, no loss of consciousness, no recent illness and no vomiting   Risk factors: no anticoagulant use     Past Medical History  Diagnosis Date  . Depressed bipolar II disorder ([place])   . Autistic disorder PT GOES TO A DAY PROGRAM DAILY AND HAS ASSISTANCE AT HOME IN THE EVERY    PT MOTHER LEGAL [place]  . Penile cyst   . Hypertension   . Hyperlipidemia    Past Surgical History  Procedure Laterality Date  . Circumcision  1990'S  . Cyst removal from penis  2013    Dr. Janice Norrie    Family History  Problem Relation Age of Onset  . Heart failure Mother   . Hypertension Mother   . Hyperlipidemia Mother    Social History  Substance Use Topics  . Smoking status: Never Smoker   . Smokeless tobacco: Never Used  . Alcohol Use: No    Review of Systems  Constitutional: Negative for fever and chills.  HENT: Negative for congestion and rhinorrhea.   Eyes: Negative for visual disturbance.  Respiratory: Negative for cough and shortness of breath.   Cardiovascular: Negative for  chest pain and palpitations.  Gastrointestinal: Positive for blood in stool and hematochezia. Negative for vomiting, abdominal pain, diarrhea, constipation and hematemesis.  Genitourinary: Negative for dysuria and difficulty urinating.  Musculoskeletal: Negative for myalgias and back pain.  Skin: Negative for pallor and rash.  Neurological: Negative for dizziness, loss of consciousness and headaches.  Psychiatric/Behavioral: Negative for confusion.      Allergies  Penicillins  Home Medications   Prior to Admission medications   Medication Sig Start Date End Date Taking? Authorizing Provider  atorvastatin (LIPITOR) 10 MG tablet Take 1 tablet (10 mg total) by mouth daily. Patient taking differently: Take 10 mg by mouth at bedtime.  11/09/12  Yes Clanford Marisa Hua, MD  buPROPion (WELLBUTRIN XL) 300 MG 24 hr tablet Take 300 mg by mouth daily.    Yes Historical Provider, MD  diphenhydrAMINE (BENADRYL) 25 mg capsule Take 25 mg by mouth daily as needed for allergies.   Yes Historical Provider, MD  gabapentin (NEURONTIN) 400 MG capsule Take 1,200 mg by mouth at bedtime. 12/04/15  Yes Historical Provider, MD  risperiDONE (RISPERDAL) 1 MG tablet Take 1 mg by mouth daily. 12/04/15  Yes Historical Provider, MD  sertraline (ZOLOFT) 100 MG tablet Take 150 mg by mouth daily. 12/04/15  Yes Historical Provider, MD  trandolapril (MAVIK) 2 MG tablet Take 1 tablet (2 mg total) by mouth every morning. Patient taking differently: Take 2 mg by mouth daily.  11/09/12  Yes Clanford Marisa Hua, MD  benzonatate (TESSALON) 100 MG capsule Take 1 capsule (100 mg total) by mouth every 8 (eight) hours. Patient not taking: Reported on 12/21/2015 11/27/15   Marella Chimes, PA-C  fexofenadine-pseudoephedrine (ALLEGRA-D) 60-120 MG per tablet Take 1 tablet by mouth every 12 (twelve) hours. Patient not taking: Reported on 12/21/2015 02/07/13   Lanae Crumbly, PA-C  fluticasone [facility]) 50 MCG/ACT nasal spray Place 2 sprays into the  nose daily. Patient not taking: Reported on 12/21/2015 02/07/13   Lanae Crumbly, PA-C   BP 112/80 mmHg  Pulse 78  Temp(Src) 97.5 F (36.4 C) (Oral)  Resp 12  SpO2 100% Physical Exam  Constitutional: He is oriented to person, place, and time. He appears well-developed and well-nourished.  HENT:  Head: Normocephalic and atraumatic.  Eyes: EOM are normal. Pupils are equal, round, and reactive to light.  Neck: Normal range of motion. Neck supple.  Cardiovascular: Normal rate, regular rhythm and intact distal pulses.   Pulmonary/Chest: Effort normal and breath sounds normal. No respiratory distress. He has no wheezes. He has no rales.  Abdominal: Soft. He exhibits no distension. There is no tenderness. There is no rebound and no guarding.  Genitourinary:  No external hemorrhoids, fissures or external signs of trauma. Scant amount of mostly light brown stool with small streaks of blood obtained.  Musculoskeletal: Normal range of motion. He exhibits no edema or tenderness.  Neurological: He is alert and oriented to person, place, and time.  Skin: Skin is warm and dry. No rash noted.  Psychiatric: He has a normal mood and affect.  Nursing note and vitals reviewed.   ED Course  Procedures (including critical care time) Labs Review Labs Reviewed  POC OCCULT BLOOD, ED - Abnormal; Notable for the following:    Fecal Occult Bld POSITIVE (*)    All other components within normal limits  COMPREHENSIVE METABOLIC PANEL  CBC  TYPE AND SCREEN  ABO/RH    Imaging Review No results found. I have personally reviewed and evaluated these images and lab results as part of my medical decision-making.   EKG Interpretation None      MDM   Final diagnoses:  Hematochezia    Patient is a 35 year old male with past medical history autistic disorder who presents with 2 episodes of bright red blood mixed with stools today. Patient had a colonoscopy on May 3 for similar rectal bleeding. Per parents  report he was found to have some benign polyps that were removed. His rectal bleeding had resolved since but recurred again today. Denies any foreign bodies in his rectum.   On presentation patient is afebrile, normotensive and not tachycardic. Abdominal exam is completely benign. Rectal exam does not reveal any external hemorrhage or fissures. Stool is heme positive. CBC and CMP are WNL. Hemoglobin is normal at 13.4. Given normal labs and patient is asymptomatic without dizziness, shortness of breath, abdominal pain, or abnormal vital signs, doubt significant bleeding  Discussed case with gastroenterology doctor on call. As this is more than 2 weeks since patient's procedure, per GI it is unlikely that this is residual bleeding from the rectal polyp. More likely patient has an internal hemorrhoid or other source of GI bleeding. Agree that patient is stable for discharge at this time. Recommend to call  Dr. Guadelupe Sabin office on Monday to set up an appointment next week. Advised in the meantime to take MiraLAX 1-2 times daily to keep his stools very soft.  Patient was discharged in stable condition. Strict return precautions were  discussed with patient and parents. Patient will return for worsening GI bleeding, dizziness, and couple episodes, abdominal pain or other new or worsening symptoms. They are in agreement with this plan. Will follow up as above  This patient was seen and discussed with Dr. Sabra Heck, ED attending    Gibson Ramp, MD 12/22/15 0139  Noemi Chapel, MD 12/23/15 1446

## 2015-12-21 NOTE — ED Provider Notes (Signed)
Recent colonscopy for rectal bleeding- had benign polyp - today had another episode of rectal bleeding with blood mixed with stool. Well appaering, soft NT abd No tachycardia Pt and parents educatexd GI consulted by resident Stable for d/c.  I saw and evaluated the patient, reviewed the resident's note and I agree with the findings and plan.    Final diagnoses:  Hematochezia      Noemi Chapel, MD 12/21/15 [phone]

## 2015-12-21 NOTE — Discharge Instructions (Signed)
Take MiraLAX 1-2 times daily to keep stools soft. Stool should be the texture of chocolate pudding.   Return to the emergency department for worsening bleeding, dizziness, passing out, severe abdominal pain or other new or worsening symptoms.

## 2015-12-21 NOTE — ED Notes (Signed)
Bright red blood in stool starting today x2 episodes. Reports to mother his bottom hurts. Had colonoscopy on May 3. Called GI, who said to come to ED.

## 2016-04-11 MED FILL — TRANDOLAPRIL 2 MG TABLET: 2 | 30 days supply | Qty: 30 | Fill #0

## 2016-04-30 MED FILL — SERTRALINE HCL 100 MG TAB: 100 | 30 days supply | Qty: 45 | Fill #0

## 2016-04-30 MED FILL — risperiDONE 1 MG TABS: 1 | 30 days supply | Qty: 30 | Fill #0

## 2016-04-30 MED FILL — GABAPENTIN 400 MG CAPSULE: 400 | 30 days supply | Qty: 90 | Fill #0

## 2016-04-30 MED FILL — BUPROPION HCL XL 300 MG TAB: 300 | 30 days supply | Qty: 30 | Fill #0

## 2016-05-08 MED FILL — TRANDOLAPRIL 2 MG TABLET: 2 | 30 days supply | Qty: 30 | Fill #1

## 2016-06-02 MED FILL — TRANDOLAPRIL 2 MG TABLET: 2 | 30 days supply | Qty: 30 | Fill #2

## 2016-06-02 MED FILL — risperiDONE 1 MG TABS: 1 | 30 days supply | Qty: 30 | Fill #1

## 2016-06-02 MED FILL — SERTRALINE HCL 100 MG TAB: 100 | 30 days supply | Qty: 45 | Fill #1

## 2016-06-02 MED FILL — GABAPENTIN 400 MG CAPSULE: 400 | 30 days supply | Qty: 90 | Fill #1

## 2016-06-02 MED FILL — BUPROPION HCL XL 300 MG TAB: 300 | 30 days supply | Qty: 30 | Fill #1

## 2016-06-09 ENCOUNTER — Ambulatory Visit: Payer: Self-pay | Admitting: General Surgery

## 2016-06-09 MED FILL — metroNIDAZOLE 500 MG TABS: 500 | 1 days supply | Qty: 6 | Fill #0

## 2016-06-09 MED FILL — NEOMYCIN 500 MG TABLET: 500 | 1 days supply | Qty: 6 | Fill #0

## 2016-06-09 NOTE — H&P (Signed)
Matthew Gross 06/09/2016 10:04 AM Location: [facility] Patient #: [id number] DOB: June 02, 1981 Single / Language: Matthew Gross / Race: Black or African American Male  History of Present Illness [name]; 06/09/2016 10:46 AM) The patient is a 35 year old male.   Note:He is referred by Dr. Watt Climes for consultation regarding multiple colonic polyps/cystic type lesions around the splenic flexure. He was sent to Dr. Kathi Der guide because he was having some rectal bleeding. Colonoscopy demonstrated multiple cystic/polypoid lesions in the splenic flexure area. This area was marked with ink. He was sent over here to discuss resection of this area. He is here with his mother, who has power of attorney, as well as an aide that helps him and stays with him. His past medical history is notable for autism, bipolar disorder, hyperlipidemia, hypertension, vitamin D deficiency.  Of note is that his father had colon cancer at an early age and died from it.  Other Problems (April Staton, [place]; 06/09/2016 10:04 AM) Depression High blood pressure  Diagnostic Studies History (April Staton, [place]; 06/09/2016 10:04 AM) Colonoscopy within last year  Allergies (April Staton, CMA; 06/09/2016 10:05 AM) Penicillins  Medication History (April Staton, CMA; 06/09/2016 10:08 AM) Zoloft (100MG  Tablet, Oral) Active. Wellbutrin XL (300MG  Tablet ER 24HR, Oral) Active. RisperiDONE (1MG  Tablet, Oral) Active. Neurontin (800MG  Tablet, Oral) Active. Lipitor (10MG  Tablet, Oral) Active. Mavik (2MG  Tablet, Oral) Active.  Social History (April Staton, CMA; 06/09/2016 10:04 AM) No alcohol use No caffeine use No drug use Tobacco use Never smoker.  Family History (April Staton, [place]; 06/09/2016 10:04 AM) Arthritis Mother. Bleeding disorder Mother. Cancer Mother. Colon Cancer Father. Colon Polyps Mother. Hypertension Father, Mother.     Review of Systems (April Staton CMA; 06/09/2016 10:04  AM) General Not Present- Appetite Loss, Chills, Fatigue, Fever, Night Sweats, Weight Gain and Weight Loss. Skin Not Present- Change in Wart/Mole, Dryness, Hives, Jaundice, New Lesions, Non-Healing Wounds, Rash and Ulcer. HEENT Present- Seasonal Allergies and Wears glasses/contact lenses. Not Present- Earache, Hearing Loss, Hoarseness, Nose Bleed, Oral Ulcers, Ringing in the Ears, Sinus Pain, Sore Throat, Visual Disturbances and Yellow Eyes. Respiratory Present- Snoring. Not Present- Bloody sputum, Chronic Cough, Difficulty Breathing and Wheezing. Breast Not Present- Breast Mass, Breast Pain, Nipple Discharge and Skin Changes. Cardiovascular Not Present- Chest Pain, Difficulty Breathing Lying Down, Leg Cramps, Palpitations, Rapid Heart Rate, Shortness of Breath and Swelling of Extremities. Gastrointestinal Not Present- Abdominal Pain, Bloating, Bloody Stool, Change in Bowel Habits, Chronic diarrhea, Constipation, Difficulty Swallowing, Excessive gas, Gets full quickly at meals, Hemorrhoids, Indigestion, Nausea, Rectal Pain and Vomiting. Male Genitourinary Not Present- Blood in Urine, Change in Urinary Stream, Frequency, Impotence, Nocturia, Painful Urination, Urgency and Urine Leakage.  Vitals (April Staton CMA; 06/09/2016 10:09 AM) 06/09/2016 10:09 AM Weight: 215 lb Height: 73in Height was reported by patient. Body Surface Area: 2.22 m Body Mass Index: 28.37 kg/m  Temp.: 97.59F(Oral)  Pulse: 78 (Regular)  P.OX: 98% (Room air) BP: 132/90 (Sitting, Left Arm, Standard)      Physical Exam [name]; 06/09/2016 10:51 AM)  The physical exam findings are as follows: Note:General: WDWN in NAD. Pleasant and cooperative.  HEENT: [place]/AT, no external nasal or ear masses, mucous membranes are moist  EYES: EOMI, no scleral icterus, wears glasses  NECK: Supple, no obvious mass or thyroid mass/enlargement, no trachea deviation  CV: RRR, no murmur, no edema  CHEST: Breath  sounds equal and clear. Respirations nonlabored.  ABDOMEN: Soft, nontender, nondistended, no masses, no organomegaly, active  bowel sounds, no scars, no hernias.  MUSCULOSKELETAL: FROM, good muscle tone  SKIN: No suspicious rashes.  NEUROLOGIC: Alert and oriented, answers questions appropriately, normal gait and station.  PSYCHIATRIC: Appropriate mood, affect , and behavior.    Assessment & Plan [name]; 06/09/2016 10:48 AM)  COLON POLYPS (K63.5) Impression: Multiple polyps of undetermined etiology involving the splenic flexure. His father had colon cancer at an early age and died from it. We discussed laparoscopic assisted partial colectomy and his mother, who is power of attorney, seems to be interested in pursuing that. We discussed the reason for that and that we wanted to know the etiology of these polyps. They understand that some of them may be precancerous or malignant.  Plan: Laparoscopic assisted partial colectomy. They want to wait until after the holidays to have the surgery. I have explained the procedure and risks of colon resection. Risks include but are not limited to bleeding, infection, wound problems, anesthesia, anastomotic leak, need for colostomy, need for reoperative surgery, injury to intraabominal organs (such as intestine, spleen, kidney, bladder, ureter, etc.), ileus, irregular bowel habits. They all seem to understand and want to proceed.  Jackolyn Confer, M.D.

## 2016-06-30 MED FILL — ATORVASTATIN 10 MG TABLET: 10 | 90 days supply | Qty: 90 | Fill #0

## 2016-07-01 MED FILL — risperiDONE 1 MG TABS: 1 | 30 days supply | Qty: 30 | Fill #2

## 2016-07-01 MED FILL — SERTRALINE HCL 100 MG TAB: 100 | 30 days supply | Qty: 45 | Fill #2

## 2016-07-01 MED FILL — BUPROPION HCL XL 300 MG TAB: 300 | 30 days supply | Qty: 30 | Fill #2

## 2016-07-01 MED FILL — TRANDOLAPRIL 2 MG TABLET: 2 | 30 days supply | Qty: 30 | Fill #3

## 2016-07-02 MED FILL — GABAPENTIN 400 MG CAPSULE: 400 | 30 days supply | Qty: 90 | Fill #0

## 2016-07-29 NOTE — Progress Notes (Signed)
CXR 11/27/15 EPIC

## 2016-07-29 NOTE — Patient Instructions (Addendum)
Matthew Gross  07/29/2016   Your procedure is scheduled on: Thursday 08/07/2016  Report to [facility] Main  Entrance take [place]  elevators to 3rd floor to  [place] at [place] AM.  Call this number if you have problems the morning of surgery [phone]   Remember: ONLY 1 PERSON MAY GO WITH YOU TO SHORT STAY TO GET  READY MORNING OF [place].  Do not eat food  :After Midnight Tuesday night clear liquids all day Wednesday 08-07-15 follow all dr Zella Richer bowel prep orders, no clear liquids after midnight Wednesday night. Marland Kitchen     CLEAR LIQUID DIET   Foods Allowed                                                                     Foods Excluded  Coffee and tea, regular and decaf                             liquids that you cannot  Plain Jell-O in any flavor                                             see through such as: Fruit ices (not with fruit pulp)                                     milk, soups, orange juice  Iced Popsicles                                    All solid food Carbonated beverages, regular and diet                                    Cranberry, grape and apple juices Sports drinks like Gatorade Lightly seasoned clear broth or consume(fat free) Sugar, honey syrup  Sample Menu Breakfast                                Lunch                                     Supper Cranberry juice                    Beef broth                            Chicken broth Jell-O                                     Grape  juice                           Apple juice Coffee or tea                        Jell-O                                      Popsicle                                                Coffee or tea                        Coffee or tea  _____________________________________________________________________     Take these medicines the morning of surgery with A SIP OF WATER: Mother prefers no meds AM of surgery                               You  may not have any metal on your body including hair pins and              piercings  Do not wear jewelry, make-up, lotions, powders or perfumes, deodorant             Do not wear nail polish.  Do not shave  48 hours prior to surgery.              Men may shave face and neck.   Do not bring valuables to the hospital. [place].  Contacts, dentures or bridgework may not be worn into surgery.  Leave suitcase in the car. After surgery it may be brought to your room.                  Please read over the following fact sheets you were given: _____________________________________________________________________             [facility] - Preparing for Surgery Before surgery, you can play an important role.  Because skin is not sterile, your skin needs to be as free of germs as possible.  You can reduce the number of germs on your skin by washing with CHG (chlorahexidine gluconate) soap before surgery.  CHG is an antiseptic cleaner which kills germs and bonds with the skin to continue killing germs even after washing. Please DO NOT use if you have an allergy to CHG or antibacterial soaps.  If your skin becomes reddened/irritated stop using the CHG and inform your nurse when you arrive at Short Stay. Do not shave (including legs and underarms) for at least 48 hours prior to the first CHG shower.  You may shave your face/neck. Please follow these instructions carefully:  1.  Shower with CHG Soap the night before surgery and the  morning of Surgery.  2.  If you choose to wash your hair, wash your hair first as usual with your  normal  shampoo.  3.  After you shampoo, rinse your hair and body thoroughly to remove the  shampoo.  4.  Use CHG as you would any other liquid soap.  You can apply chg directly  to the skin and wash                       Gently with a scrungie or clean washcloth.  5.  Apply the CHG Soap to your body ONLY  FROM THE NECK DOWN.   Do not use on face/ open                           Wound or open sores. Avoid contact with eyes, ears mouth and genitals (private parts).                       Wash face,  Genitals (private parts) with your normal soap.             6.  Wash thoroughly, paying special attention to the area where your surgery  will be performed.  7.  Thoroughly rinse your body with warm water from the neck down.  8.  DO NOT shower/wash with your normal soap after using and rinsing off  the CHG Soap.                9.  Pat yourself dry with a clean towel.            10.  Wear clean pajamas.            11.  Place clean sheets on your bed the night of your first shower and do not  sleep with pets. Day of Surgery : Do not apply any lotions/deodorants the morning of surgery.  Please wear clean clothes to the hospital/surgery center.  FAILURE TO FOLLOW THESE INSTRUCTIONS MAY RESULT IN THE CANCELLATION OF YOUR SURGERY PATIENT SIGNATURE_________________________________  NURSE SIGNATURE__________________________________  ________________________________________________________________________ [facility] - Preparing for Surgery Before surgery, you can play an important role.  Because skin is not sterile, your skin needs to be as free of germs as possible.  You can reduce the number of germs on your skin by washing with CHG (chlorahexidine gluconate) soap before surgery.  CHG is an antiseptic cleaner which kills germs and bonds with the skin to continue killing germs even after washing. Please DO NOT use if you have an allergy to CHG or antibacterial soaps.  If your skin becomes reddened/irritated stop using the CHG and inform your nurse when you arrive at Short Stay. Do not shave (including legs and underarms) for at least 48 hours prior to the first CHG shower.  You may shave your face/neck. Please follow these instructions carefully:  1.  Shower with CHG Soap the night before surgery and the   morning of Surgery.  2.  If you choose to wash your hair, wash your hair first as usual with your  normal  shampoo.  3.  After you shampoo, rinse your hair and body thoroughly to remove the  shampoo.                           4.  Use CHG as you would any other liquid soap.  You can apply chg directly  to the skin and wash                       Gently with a scrungie or clean washcloth.  5.  Apply the  CHG Soap to your body ONLY FROM THE NECK DOWN.   Do not use on face/ open                           Wound or open sores. Avoid contact with eyes, ears mouth and genitals (private parts).                       Wash face,  Genitals (private parts) with your normal soap.             6.  Wash thoroughly, paying special attention to the area where your surgery  will be performed.  7.  Thoroughly rinse your body with warm water from the neck down.  8.  DO NOT shower/wash with your normal soap after using and rinsing off  the CHG Soap.                9.  Pat yourself dry with a clean towel.            10.  Wear clean pajamas.            11.  Place clean sheets on your bed the night of your first shower and do not  sleep with pets. Day of Surgery : Do not apply any lotions/deodorants the morning of surgery.  Please wear clean clothes to the hospital/surgery center.  FAILURE TO FOLLOW THESE INSTRUCTIONS MAY RESULT IN THE CANCELLATION OF YOUR SURGERY PATIENT SIGNATURE_________________________________  NURSE SIGNATURE__________________________________  ________________________________________________________________________

## 2016-07-31 ENCOUNTER — Encounter (HOSPITAL_COMMUNITY): Payer: Self-pay

## 2016-07-31 ENCOUNTER — Encounter (HOSPITAL_COMMUNITY)
Admission: RE | Admit: 2016-07-31 | Discharge: 2016-07-31 | Disposition: A | Discharge status: Home / Self Care | Payer: Medicaid Other | Source: Ambulatory Visit | Attending: General Surgery | Admitting: General Surgery

## 2016-07-31 DIAGNOSIS — I10 Essential (primary) hypertension: Secondary | ICD-10-CM | POA: Diagnosis not present

## 2016-07-31 DIAGNOSIS — K635 Polyp of colon: Secondary | ICD-10-CM | POA: Insufficient documentation

## 2016-07-31 DIAGNOSIS — Z01812 Encounter for preprocedural laboratory examination: Secondary | ICD-10-CM | POA: Insufficient documentation

## 2016-07-31 DIAGNOSIS — Z0183 Encounter for blood typing: Secondary | ICD-10-CM | POA: Diagnosis not present

## 2016-07-31 DIAGNOSIS — Z01818 Encounter for other preprocedural examination: Secondary | ICD-10-CM | POA: Diagnosis not present

## 2016-07-31 HISTORY — DX: Polyp of colon: K63.5

## 2016-07-31 HISTORY — DX: Hemorrhage of anus and rectum: K62.5

## 2016-07-31 LAB — CBC WITH DIFFERENTIAL/PLATELET
BASOS PCT: 0 %
Basophils Absolute: 0 10*3/uL (ref 0.0–0.1)
EOS ABS: 0.2 10*3/uL (ref 0.0–0.7)
EOS PCT: 4 %
HCT: 42.8 % (ref 39.0–52.0)
HEMOGLOBIN: 14.8 g/dL (ref 13.0–17.0)
Lymphocytes Relative: 40 %
Lymphs Abs: 1.9 10*3/uL (ref 0.7–4.0)
MCH: 28.8 pg (ref 26.0–34.0)
MCHC: 34.6 g/dL (ref 30.0–36.0)
MCV: 83.4 fL (ref 78.0–100.0)
MONOS PCT: 6 %
Monocytes Absolute: 0.3 10*3/uL (ref 0.1–1.0)
NEUTROS PCT: 50 %
Neutro Abs: 2.3 10*3/uL (ref 1.7–7.7)
PLATELETS: 206 10*3/uL (ref 150–400)
RBC: 5.13 MIL/uL (ref 4.22–5.81)
RDW: 13.1 % (ref 11.5–15.5)
WBC: 4.7 10*3/uL (ref 4.0–10.5)

## 2016-07-31 LAB — COMPREHENSIVE METABOLIC PANEL
ALBUMIN: 4.4 g/dL (ref 3.5–5.0)
ALK PHOS: 53 U/L (ref 38–126)
ALT: 32 U/L (ref 17–63)
ANION GAP: 5 (ref 5–15)
AST: 26 U/L (ref 15–41)
BUN: 11 mg/dL (ref 6–20)
CHLORIDE: 106 mmol/L (ref 101–111)
CO2: 28 mmol/L (ref 22–32)
Calcium: 9.3 mg/dL (ref 8.9–10.3)
Creatinine, Ser: 1.02 mg/dL (ref 0.61–1.24)
GFR calc non Af Amer: >60 mL/min (ref >60)
GLUCOSE: 84 mg/dL (ref 65–99)
POTASSIUM: 4.7 mmol/L (ref 3.5–5.1)
SODIUM: 139 mmol/L (ref 135–145)
Total Bilirubin: 0.8 mg/dL (ref 0.3–1.2)
Total Protein: 7.7 g/dL (ref 6.5–8.1)

## 2016-07-31 LAB — ABO/RH: ABO/RH(D): O POS

## 2016-08-01 LAB — HEMOGLOBIN A1C
HEMOGLOBIN A1C: 5.1 % (ref 4.8–5.6)
MEAN PLASMA GLUCOSE: 100 mg/dL

## 2016-08-05 MED FILL — risperiDONE 1 MG TABS: 1 | 30 days supply | Qty: 30 | Fill #3

## 2016-08-05 MED FILL — SERTRALINE HCL 100 MG TAB: 100 | 30 days supply | Qty: 45 | Fill #3

## 2016-08-05 MED FILL — GABAPENTIN 400 MG CAPSULE: 400 | 30 days supply | Qty: 90 | Fill #1

## 2016-08-05 MED FILL — TRANDOLAPRIL 2 MG TABLET: 2 | 30 days supply | Qty: 30 | Fill #4

## 2016-08-05 MED FILL — BUPROPION HCL XL 300 MG TAB: 300 | 30 days supply | Qty: 30 | Fill #3

## 2016-08-06 MED ORDER — GENTAMICIN SULFATE 40 MG/ML IJ SOLN
5.0000 mg/kg | INTRAVENOUS | Status: AC
  Administered 2016-08-07: 480 mg via INTRAVENOUS
  Filled 2016-08-06: qty 12

## 2016-08-06 MED ORDER — CLINDAMYCIN PHOSPHATE 900 MG/50ML IV SOLN
900.0000 mg | INTRAVENOUS | Status: AC
  Administered 2016-08-07: 900 mg via INTRAVENOUS

## 2016-08-06 NOTE — Anesthesia Preprocedure Evaluation (Signed)
Anesthesia Evaluation  Patient identified by MRN, date of birth, ID band Patient awake    Reviewed: Allergy & Precautions, H&P , Patient's Chart, lab work & pertinent test results, reviewed documented beta blocker date and time   Airway Mallampati: II  TM Distance: >3 FB Neck ROM: full    Dental no notable dental hx.    Pulmonary    Pulmonary exam normal breath sounds clear to auscultation       Cardiovascular hypertension,  Rhythm:regular Rate:Normal     Neuro/Psych PSYCHIATRIC DISORDERS    GI/Hepatic   Endo/Other    Renal/GU      Musculoskeletal   Abdominal   Peds  Hematology   Anesthesia Other Findings Easy LMA prior Mental REtardation  Reproductive/Obstetrics                             Anesthesia Physical Anesthesia Plan  ASA: II  Anesthesia Plan: General   Post-op Pain Management:    Induction: Intravenous  Airway Management Planned: Oral ETT  Additional Equipment:   Intra-op Plan:   Post-operative Plan: Extubation in OR  Informed Consent: I have reviewed the patients History and Physical, chart, labs and discussed the procedure including the risks, benefits and alternatives for the proposed anesthesia with the patient or authorized representative who has indicated his/her understanding and acceptance.   Dental Advisory Given and Dental advisory given  Plan Discussed with: CRNA and Surgeon  Anesthesia Plan Comments: (  Discussed general anesthesia, including possible nausea, instrumentation of airway, sore throat,pulmonary aspiration, etc. I asked if the were any outstanding questions, or  concerns before we proceeded.)        Anesthesia Quick Evaluation

## 2016-08-07 ENCOUNTER — Inpatient Hospital Stay (HOSPITAL_COMMUNITY): Payer: Medicaid Other | Admitting: Anesthesiology

## 2016-08-07 ENCOUNTER — Encounter (HOSPITAL_COMMUNITY)
Admission: RE | Disposition: A | Discharge status: Home / Self Care | Payer: Self-pay | Source: Ambulatory Visit | Attending: General Surgery

## 2016-08-07 ENCOUNTER — Encounter (HOSPITAL_COMMUNITY): Payer: Self-pay | Admitting: *Deleted

## 2016-08-07 ENCOUNTER — Inpatient Hospital Stay (HOSPITAL_COMMUNITY)
Admission: RE | Admit: 2016-08-07 | Discharge: 2016-08-12 | DRG: 330 | Disposition: A | Discharge status: Home / Self Care | Payer: Medicaid Other | Source: Ambulatory Visit | Attending: General Surgery | Admitting: General Surgery

## 2016-08-07 DIAGNOSIS — I10 Essential (primary) hypertension: Secondary | ICD-10-CM | POA: Diagnosis present

## 2016-08-07 DIAGNOSIS — Z800 Family history of malignant neoplasm of digestive organs: Secondary | ICD-10-CM | POA: Diagnosis not present

## 2016-08-07 DIAGNOSIS — K6389 Other specified diseases of intestine: Secondary | ICD-10-CM | POA: Diagnosis present

## 2016-08-07 DIAGNOSIS — D126 Benign neoplasm of colon, unspecified: Secondary | ICD-10-CM | POA: Diagnosis present

## 2016-08-07 DIAGNOSIS — E785 Hyperlipidemia, unspecified: Secondary | ICD-10-CM | POA: Diagnosis present

## 2016-08-07 DIAGNOSIS — F3181 Bipolar II disorder: Secondary | ICD-10-CM | POA: Diagnosis present

## 2016-08-07 DIAGNOSIS — F840 Autistic disorder: Secondary | ICD-10-CM | POA: Diagnosis present

## 2016-08-07 DIAGNOSIS — Z8249 Family history of ischemic heart disease and other diseases of the circulatory system: Secondary | ICD-10-CM

## 2016-08-07 DIAGNOSIS — K635 Polyp of colon: Secondary | ICD-10-CM | POA: Diagnosis present

## 2016-08-07 HISTORY — PX: LAPAROSCOPIC PARTIAL COLECTOMY: SHX5907

## 2016-08-07 LAB — CBC
HCT: 41.7 % (ref 39.0–52.0)
Hemoglobin: 14.6 g/dL (ref 13.0–17.0)
MCH: 28.2 pg (ref 26.0–34.0)
MCHC: 35 g/dL (ref 30.0–36.0)
MCV: 80.7 fL (ref 78.0–100.0)
PLATELETS: 189 10*3/uL (ref 150–400)
RBC: 5.17 MIL/uL (ref 4.22–5.81)
RDW: 12.7 % (ref 11.5–15.5)
WBC: 11.8 10*3/uL — AB (ref 4.0–10.5)

## 2016-08-07 LAB — CREATININE, SERUM: Creatinine, Ser: 0.96 mg/dL (ref 0.61–1.24)

## 2016-08-07 LAB — TYPE AND SCREEN
ABO/RH(D): O POS
ANTIBODY SCREEN: NEGATIVE

## 2016-08-07 SURGERY — LAPAROSCOPIC PARTIAL COLECTOMY
Anesthesia: General | Site: Abdomen

## 2016-08-07 MED ORDER — HYDROMORPHONE HCL 1 MG/ML IJ SOLN
INTRAMUSCULAR | Status: AC
  Filled 2016-08-07: qty 1

## 2016-08-07 MED ORDER — CLINDAMYCIN PHOSPHATE 900 MG/50ML IV SOLN
900.0000 mg | Freq: Three times a day (TID) | INTRAVENOUS | Status: AC
  Administered 2016-08-07: 900 mg via INTRAVENOUS
  Filled 2016-08-07: qty 50

## 2016-08-07 MED ORDER — PROPOFOL 10 MG/ML IV BOLUS
INTRAVENOUS | Status: DC | PRN
  Administered 2016-08-07: 150 mg via INTRAVENOUS

## 2016-08-07 MED ORDER — ROCURONIUM BROMIDE 50 MG/5ML IV SOSY
PREFILLED_SYRINGE | INTRAVENOUS | Status: AC
  Filled 2016-08-07: qty 5

## 2016-08-07 MED ORDER — ALVIMOPAN 12 MG PO CAPS
12.0000 mg | ORAL_CAPSULE | Freq: Once | ORAL | Status: AC
  Administered 2016-08-07: 12 mg via ORAL
  Filled 2016-08-07: qty 1

## 2016-08-07 MED ORDER — CLINDAMYCIN PHOSPHATE 900 MG/50ML IV SOLN
INTRAVENOUS | Status: AC
  Filled 2016-08-07: qty 50

## 2016-08-07 MED ORDER — KCL IN DEXTROSE-NACL 20-5-0.9 MEQ/L-%-% IV SOLN
INTRAVENOUS | Status: DC
  Administered 2016-08-07 – 2016-08-10 (×6): via INTRAVENOUS
  Administered 2016-08-10: 1000 mL via INTRAVENOUS
  Filled 2016-08-07 (×10): qty 1000

## 2016-08-07 MED ORDER — ENOXAPARIN SODIUM 40 MG/0.4ML ~~LOC~~ SOLN
40.0000 mg | SUBCUTANEOUS | Status: DC
  Administered 2016-08-08 – 2016-08-12 (×5): 40 mg via SUBCUTANEOUS
  Filled 2016-08-07 (×5): qty 0.4

## 2016-08-07 MED ORDER — FENTANYL CITRATE (PF) 100 MCG/2ML IJ SOLN
INTRAMUSCULAR | Status: DC | PRN
  Administered 2016-08-07 (×4): 50 ug via INTRAVENOUS

## 2016-08-07 MED ORDER — DEXAMETHASONE SODIUM PHOSPHATE 10 MG/ML IJ SOLN
INTRAMUSCULAR | Status: DC | PRN
  Administered 2016-08-07: 10 mg via INTRAVENOUS

## 2016-08-07 MED ORDER — DEXAMETHASONE SODIUM PHOSPHATE 10 MG/ML IJ SOLN
10.0000 mg | Freq: Once | INTRAMUSCULAR | Status: DC
  Filled 2016-08-07: qty 2.5

## 2016-08-07 MED ORDER — 0.9 % SODIUM CHLORIDE (POUR BTL) OPTIME
TOPICAL | Status: DC | PRN
  Administered 2016-08-07: 4000 mL

## 2016-08-07 MED ORDER — BUPIVACAINE HCL (PF) 0.5 % IJ SOLN
INTRAMUSCULAR | Status: DC | PRN
  Administered 2016-08-07: 5 mL

## 2016-08-07 MED ORDER — METHOCARBAMOL 1000 MG/10ML IJ SOLN
500.0000 mg | Freq: Three times a day (TID) | INTRAVENOUS | Status: DC
  Administered 2016-08-07 – 2016-08-08 (×3): 500 mg via INTRAVENOUS
  Filled 2016-08-07: qty 550
  Filled 2016-08-07: qty 5
  Filled 2016-08-07: qty 550
  Filled 2016-08-07 (×3): qty 5
  Filled 2016-08-07: qty 550
  Filled 2016-08-07: qty 5

## 2016-08-07 MED ORDER — EPHEDRINE SULFATE 50 MG/ML IJ SOLN
INTRAMUSCULAR | Status: DC | PRN
  Administered 2016-08-07: 5 mg via INTRAVENOUS
  Administered 2016-08-07 (×2): 10 mg via INTRAVENOUS
  Administered 2016-08-07 (×4): 5 mg via INTRAVENOUS

## 2016-08-07 MED ORDER — LIDOCAINE 2% (20 MG/ML) 5 ML SYRINGE
INTRAMUSCULAR | Status: DC | PRN
  Administered 2016-08-07: 100 mg via INTRAVENOUS

## 2016-08-07 MED ORDER — SUCCINYLCHOLINE CHLORIDE 200 MG/10ML IV SOSY
PREFILLED_SYRINGE | INTRAVENOUS | Status: AC
  Filled 2016-08-07: qty 10

## 2016-08-07 MED ORDER — HYDROMORPHONE HCL 1 MG/ML IJ SOLN
0.2500 mg | INTRAMUSCULAR | Status: DC | PRN
  Administered 2016-08-07 (×4): 0.5 mg via INTRAVENOUS

## 2016-08-07 MED ORDER — MORPHINE SULFATE (PF) 2 MG/ML IV SOLN
2.0000 mg | INTRAVENOUS | Status: DC | PRN
  Administered 2016-08-07: 2 mg via INTRAVENOUS
  Administered 2016-08-07 – 2016-08-08 (×7): 4 mg via INTRAVENOUS
  Administered 2016-08-08: 2 mg via INTRAVENOUS
  Administered 2016-08-08 (×2): 4 mg via INTRAVENOUS
  Administered 2016-08-08: 2 mg via INTRAVENOUS
  Administered 2016-08-08 – 2016-08-11 (×11): 4 mg via INTRAVENOUS
  Filled 2016-08-07 (×3): qty 2
  Filled 2016-08-07: qty 1
  Filled 2016-08-07 (×13): qty 2
  Filled 2016-08-07: qty 1
  Filled 2016-08-07 (×4): qty 2
  Filled 2016-08-07: qty 1

## 2016-08-07 MED ORDER — LACTATED RINGERS IR SOLN
Status: DC | PRN
  Administered 2016-08-07: 3000 mL

## 2016-08-07 MED ORDER — PANTOPRAZOLE SODIUM 40 MG IV SOLR
40.0000 mg | INTRAVENOUS | Status: DC
  Administered 2016-08-07 – 2016-08-09 (×3): 40 mg via INTRAVENOUS
  Filled 2016-08-07 (×3): qty 40

## 2016-08-07 MED ORDER — BUPROPION HCL ER (XL) 300 MG PO TB24
300.0000 mg | ORAL_TABLET | Freq: Every day | ORAL | Status: DC
  Administered 2016-08-08 – 2016-08-12 (×5): 300 mg via ORAL
  Filled 2016-08-07 (×5): qty 1

## 2016-08-07 MED ORDER — LACTATED RINGERS IV SOLN
INTRAVENOUS | Status: DC
Start: 2016-08-07 — End: 2016-08-07

## 2016-08-07 MED ORDER — FENTANYL CITRATE (PF) 100 MCG/2ML IJ SOLN
INTRAMUSCULAR | Status: AC
  Filled 2016-08-07: qty 4

## 2016-08-07 MED ORDER — TRANDOLAPRIL 2 MG PO TABS
2.0000 mg | ORAL_TABLET | Freq: Every day | ORAL | Status: DC
  Administered 2016-08-08 – 2016-08-12 (×5): 2 mg via ORAL
  Filled 2016-08-07 (×6): qty 1

## 2016-08-07 MED ORDER — ONDANSETRON HCL 4 MG PO TABS: 4.0000 mg | ORAL_TABLET | Freq: Four times a day (QID) | ORAL | Status: DC | PRN

## 2016-08-07 MED ORDER — DEXAMETHASONE SODIUM PHOSPHATE 10 MG/ML IJ SOLN
INTRAMUSCULAR | Status: AC
  Filled 2016-08-07: qty 1

## 2016-08-07 MED ORDER — LACTATED RINGERS IV SOLN
INTRAVENOUS | Status: DC | PRN
  Administered 2016-08-07 (×3): via INTRAVENOUS

## 2016-08-07 MED ORDER — SUCCINYLCHOLINE CHLORIDE 200 MG/10ML IV SOSY
PREFILLED_SYRINGE | INTRAVENOUS | Status: DC | PRN
  Administered 2016-08-07: 100 mg via INTRAVENOUS

## 2016-08-07 MED ORDER — ONDANSETRON HCL 4 MG/2ML IJ SOLN: 4.0000 mg | INTRAMUSCULAR | Status: DC | PRN

## 2016-08-07 MED ORDER — SERTRALINE HCL 100 MG PO TABS
150.0000 mg | ORAL_TABLET | Freq: Every day | ORAL | Status: DC
  Administered 2016-08-08 – 2016-08-12 (×5): 150 mg via ORAL
  Filled 2016-08-07 (×5): qty 1

## 2016-08-07 MED ORDER — PROPOFOL 10 MG/ML IV BOLUS
INTRAVENOUS | Status: AC
  Filled 2016-08-07: qty 20

## 2016-08-07 MED ORDER — EPHEDRINE 5 MG/ML INJ
INTRAVENOUS | Status: AC
  Filled 2016-08-07: qty 10

## 2016-08-07 MED ORDER — ALVIMOPAN 12 MG PO CAPS
12.0000 mg | ORAL_CAPSULE | Freq: Two times a day (BID) | ORAL | Status: DC
  Administered 2016-08-08 – 2016-08-10 (×5): 12 mg via ORAL
  Filled 2016-08-07 (×6): qty 1

## 2016-08-07 MED ORDER — LIDOCAINE 2% (20 MG/ML) 5 ML SYRINGE
INTRAMUSCULAR | Status: AC
  Filled 2016-08-07: qty 5

## 2016-08-07 MED ORDER — SUGAMMADEX SODIUM 200 MG/2ML IV SOLN
INTRAVENOUS | Status: AC
  Filled 2016-08-07: qty 2

## 2016-08-07 MED ORDER — ONDANSETRON HCL 4 MG/2ML IJ SOLN
INTRAMUSCULAR | Status: AC
  Filled 2016-08-07: qty 2

## 2016-08-07 MED ORDER — BUPIVACAINE HCL (PF) 0.5 % IJ SOLN
INTRAMUSCULAR | Status: AC
  Filled 2016-08-07: qty 30

## 2016-08-07 MED ORDER — ONDANSETRON HCL 4 MG/2ML IJ SOLN
INTRAMUSCULAR | Status: DC | PRN
  Administered 2016-08-07: 4 mg via INTRAVENOUS

## 2016-08-07 MED ORDER — ROCURONIUM BROMIDE 50 MG/5ML IV SOSY
PREFILLED_SYRINGE | INTRAVENOUS | Status: DC | PRN
  Administered 2016-08-07 (×2): 10 mg via INTRAVENOUS
  Administered 2016-08-07: 50 mg via INTRAVENOUS
  Administered 2016-08-07: 10 mg via INTRAVENOUS
  Administered 2016-08-07: 5 mg via INTRAVENOUS

## 2016-08-07 MED ORDER — SUGAMMADEX SODIUM 200 MG/2ML IV SOLN
INTRAVENOUS | Status: DC | PRN
  Administered 2016-08-07: 200 mg via INTRAVENOUS

## 2016-08-07 MED ORDER — GABAPENTIN 400 MG PO CAPS
800.0000 mg | ORAL_CAPSULE | Freq: Every day | ORAL | Status: DC
  Administered 2016-08-08 – 2016-08-10 (×3): 800 mg via ORAL
  Filled 2016-08-07 (×5): qty 2

## 2016-08-07 MED ORDER — RISPERIDONE 1 MG PO TABS
1.0000 mg | ORAL_TABLET | ORAL | Status: DC
  Administered 2016-08-09 – 2016-08-11 (×2): 1 mg via ORAL
  Filled 2016-08-07 (×2): qty 1

## 2016-08-07 SURGICAL SUPPLY — 80 items
APPLIER CLIP 5 13 M/L LIGAMAX5 (MISCELLANEOUS)
APPLIER CLIP ROT 10 11.4 M/L (STAPLE)
APR CLP MED LRG 11.4X10 (STAPLE)
APR CLP MED LRG 5 ANG JAW (MISCELLANEOUS)
BLADE EXTENDED COATED 6.5IN (ELECTRODE) IMPLANT
BLADE HEX COATED 2.75 (ELECTRODE) ×3 IMPLANT
CABLE HIGH FREQUENCY MONO STRZ (ELECTRODE) ×3 IMPLANT
CELLS DAT CNTRL 66122 CELL SVR (MISCELLANEOUS) IMPLANT
CLIP APPLIE 5 13 M/L LIGAMAX5 (MISCELLANEOUS) IMPLANT
CLIP APPLIE ROT 10 11.4 M/L (STAPLE) IMPLANT
COUNTER NEEDLE 20 DBL MAG RED (NEEDLE) ×3 IMPLANT
COVER MAYO STAND STRL (DRAPES) ×9 IMPLANT
COVER SURGICAL LIGHT HANDLE (MISCELLANEOUS) ×6 IMPLANT
DECANTER SPIKE VIAL GLASS SM (MISCELLANEOUS) ×1 IMPLANT
DISSECTOR BLUNT TIP ENDO 5MM (MISCELLANEOUS) IMPLANT
DRAIN CHANNEL 19F RND (DRAIN) IMPLANT
DRAPE LAPAROSCOPIC ABDOMINAL (DRAPES) ×3 IMPLANT
DRAPE SURG IRRIG POUCH 19X23 (DRAPES) ×3 IMPLANT
DRSG OPSITE POSTOP 4X10 (GAUZE/BANDAGES/DRESSINGS) IMPLANT
DRSG OPSITE POSTOP 4X6 (GAUZE/BANDAGES/DRESSINGS) ×2 IMPLANT
DRSG OPSITE POSTOP 4X8 (GAUZE/BANDAGES/DRESSINGS) IMPLANT
DRSG TEGADERM 2-3/8X2-3/4 SM (GAUZE/BANDAGES/DRESSINGS) ×2 IMPLANT
DRSG TEGADERM 4X4.75 (GAUZE/BANDAGES/DRESSINGS) ×2 IMPLANT
ELECT PENCIL ROCKER SW 15FT (MISCELLANEOUS) ×4 IMPLANT
ELECT REM PT RETURN 15FT ADLT (MISCELLANEOUS) ×3 IMPLANT
EVACUATOR SILICONE 100CC (DRAIN) IMPLANT
FILTER SMOKE EVAC LAPAROSHD (FILTER) IMPLANT
GAUZE SPONGE 2X2 8PLY STRL LF (GAUZE/BANDAGES/DRESSINGS) IMPLANT
GAUZE SPONGE 4X4 12PLY STRL (GAUZE/BANDAGES/DRESSINGS) ×3 IMPLANT
GLOVE ECLIPSE 8.0 STRL XLNG CF (GLOVE) ×6 IMPLANT
GLOVE INDICATOR 8.0 STRL GRN (GLOVE) ×6 IMPLANT
GOWN STRL REUS W/TWL XL LVL3 (GOWN DISPOSABLE) ×12 IMPLANT
HANDLE SUCTION POOLE (INSTRUMENTS) IMPLANT
HOLDER FOLEY CATH W/STRAP (MISCELLANEOUS) ×3 IMPLANT
IRRIG SUCT STRYKERFLOW 2 WTIP (MISCELLANEOUS) ×3
IRRIGATION SUCT STRKRFLW 2 WTP (MISCELLANEOUS) ×1 IMPLANT
LEGGING LITHOTOMY PAIR STRL (DRAPES) ×3 IMPLANT
LIGASURE IMPACT 36 18CM CVD LR (INSTRUMENTS) ×2 IMPLANT
PACK COLON (CUSTOM PROCEDURE TRAY) ×3 IMPLANT
PAD POSITIONING PINK XL (MISCELLANEOUS) ×3 IMPLANT
PORT LAP GEL ALEXIS MED 5-9CM (MISCELLANEOUS) IMPLANT
RELOAD PROXIMATE 75MM BLUE (ENDOMECHANICALS) ×6 IMPLANT
RELOAD STAPLE 75 3.8 BLU REG (ENDOMECHANICALS) IMPLANT
RETRACTOR WND ALEXIS 18 MED (MISCELLANEOUS) IMPLANT
RTRCTR WOUND ALEXIS 18CM MED (MISCELLANEOUS)
SCISSORS LAP 5X35 DISP (ENDOMECHANICALS) ×3 IMPLANT
SEALER TISSUE X1 CVD JAW (INSTRUMENTS) IMPLANT
SET IRRIG TUBING LAPAROSCOPIC (IRRIGATION / IRRIGATOR) ×2 IMPLANT
SHEARS HARMONIC ACE PLUS 36CM (ENDOMECHANICALS) ×1 IMPLANT
SHEARS HARMONIC ACE PLUS 45CM (MISCELLANEOUS) ×2 IMPLANT
SLEEVE XCEL OPT CAN 5 100 (ENDOMECHANICALS) ×9 IMPLANT
SPONGE DRAIN TRACH 4X4 STRL 2S (GAUZE/BANDAGES/DRESSINGS) IMPLANT
SPONGE GAUZE 2X2 STER 10/PKG (GAUZE/BANDAGES/DRESSINGS) ×2
STAPLER 90 3.5 STAND SLIM (STAPLE) ×3
STAPLER 90 3.5 STD SLIM (STAPLE) IMPLANT
STAPLER PROXIMATE 75MM BLUE (STAPLE) ×2 IMPLANT
STAPLER VISISTAT 35W (STAPLE) ×3 IMPLANT
SUCTION POOLE HANDLE (INSTRUMENTS) ×3
SUT ETHILON 3 0 PS 1 (SUTURE) IMPLANT
SUT MNCRL AB 4-0 PS2 18 (SUTURE) ×2 IMPLANT
SUT PDS AB 1 CTX 36 (SUTURE) ×2 IMPLANT
SUT PDS AB 1 TP1 96 (SUTURE) IMPLANT
SUT PROLENE 2 0 SH DA (SUTURE) ×1 IMPLANT
SUT SILK 2 0 (SUTURE) ×3
SUT SILK 2 0 SH CR/8 (SUTURE) ×1 IMPLANT
SUT SILK 2-0 18XBRD TIE 12 (SUTURE) ×1 IMPLANT
SUT SILK 3 0 (SUTURE) ×3
SUT SILK 3 0 SH CR/8 (SUTURE) ×5 IMPLANT
SUT SILK 3-0 18XBRD TIE 12 (SUTURE) ×1 IMPLANT
SUT VICRYL 2 0 18  UND BR (SUTURE) ×2
SUT VICRYL 2 0 18 UND BR (SUTURE) ×1 IMPLANT
SYS LAPSCP GELPORT 120MM (MISCELLANEOUS)
SYSTEM LAPSCP GELPORT 120MM (MISCELLANEOUS) IMPLANT
TOWEL OR 17X26 10 PK STRL BLUE (TOWEL DISPOSABLE) ×3 IMPLANT
TOWEL OR NON WOVEN STRL DISP B (DISPOSABLE) ×3 IMPLANT
TRAY FOLEY W/METER SILVER 16FR (SET/KITS/TRAYS/PACK) IMPLANT
TROCAR BLADELESS OPT 5 100 (ENDOMECHANICALS) ×3 IMPLANT
TROCAR XCEL BLUNT TIP 100MML (ENDOMECHANICALS) ×2 IMPLANT
TROCAR XCEL NON-BLD 11X100MML (ENDOMECHANICALS) IMPLANT
TUBING INSUF HEATED (TUBING) ×3 IMPLANT

## 2016-08-07 NOTE — Interval H&P Note (Signed)
History and Physical Interval Note:  08/07/2016 7:26 AM  Matthew Gross  has presented today for surgery, with the diagnosis of COLON POLYPS  The various methods of treatment have been discussed with the patient and family. After consideration of risks, benefits and other options for treatment, the patient has consented to  Procedure(s): LAPAROSCOPIC ASSISTED PARTIAL COLECTOMY (N/A) as a surgical intervention .  The patient's history has been reviewed, patient examined, no change in status, stable for surgery.  I have reviewed the patient's chart and labs.  Questions were answered to the patient's satisfaction.     Dorrien Grunder Lenna Sciara

## 2016-08-07 NOTE — Anesthesia Procedure Notes (Signed)
Procedure Name: Intubation Date/Time: 08/07/2016 7:38 AM Performed by: Maxwell Caul Pre-anesthesia Checklist: Patient identified, Emergency Drugs available, Suction available and Patient being monitored Patient Re-evaluated:Patient Re-evaluated prior to inductionOxygen Delivery Method: Circle system utilized Preoxygenation: Pre-oxygenation with 100% oxygen Intubation Type: IV induction Ventilation: Mask ventilation without difficulty Laryngoscope Size: Mac and 4 Grade View: Grade I Tube type: Oral Tube size: 7.5 mm Number of attempts: 1 Airway Equipment and Method: Stylet Placement Confirmation: ETT inserted through vocal cords under direct vision,  positive ETCO2 and breath sounds checked- equal and bilateral Secured at: 21 cm Tube secured with: Tape Dental Injury: Teeth and Oropharynx as per pre-operative assessment

## 2016-08-07 NOTE — Transfer of Care (Signed)
Immediate Anesthesia Transfer of Care Note  Patient: Matthew Gross  Procedure(s) Performed: Procedure(s): LAPAROSCOPIC HAND ASSISTED LEFT HEMICOLECTOMY WITH SPLENIC FLEXURE MOBILIZATION (N/A)  Patient Location: PACU  Anesthesia Type:General  Level of Consciousness:  sedated, patient cooperative and responds to stimulation  Airway & Oxygen Therapy:Patient Spontanous Breathing and Patient connected to face mask oxgen  Post-op Assessment:  Report given to PACU RN and Post -op Vital signs reviewed and stable  Post vital signs:  Reviewed and stable  Last Vitals:  Vitals:   08/07/16 0610  BP: 135/89  Pulse: 78  Resp: 18  Temp: 123XX123 C    Complications: No apparent anesthesia complications

## 2016-08-07 NOTE — Anesthesia Postprocedure Evaluation (Signed)
Anesthesia Post Note  Patient: Matthew Gross  Procedure(s) Performed: Procedure(s) (LRB): LAPAROSCOPIC HAND ASSISTED LEFT HEMICOLECTOMY WITH SPLENIC FLEXURE MOBILIZATION (N/A)  Patient location during evaluation: PACU Anesthesia Type: General Level of consciousness: sedated Pain management: satisfactory to patient Vital Signs Assessment: post-procedure vital signs reviewed and stable Respiratory status: spontaneous breathing Cardiovascular status: stable Anesthetic complications: no        Last Vitals:  Vitals:   08/07/16 1130 08/07/16 1230  BP: 133/80 130/76  Pulse: 98 98  Resp:  18  Temp: 36.4 C 36.7 C    Last Pain:  Vitals:   08/07/16 1335  TempSrc:   PainSc: 3    Pain Goal: Patients Stated Pain Goal: 2 (08/07/16 0543)               Riccardo Dubin

## 2016-08-07 NOTE — Op Note (Addendum)
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  Asst.:  Autumn Messing, M.D.  Anesthesia:  General  Indication:  This is a 36 year old male was having some rectal bleeding. He underwent a colonoscopy which demonstrated multiple polypoid lesions along with cystic lesions at the splenic flexure of unknown etiology.  These could not be removed by way of colonoscopy. He has a family history of colon cancer. He now presents for the above procedure.  Procedure Detail:  He was seen in the holding area and then was brought to the operating room placed supine on the operating table and a general anesthetic was given. He was placed in the lithotomy position. A Foley catheter was inserted. A orogastric tube was inserted. The abdominal wall and perineal areas were then widely sterilely prepped and draped. A timeout was performed.  A small subumbilical incision was made through the skin and subcutaneous tissues until the midline fascia was identified. A small incision was made in the midline fascia and peritoneum under direct vision entering the peritoneal cavity under direct vision. A pursestring suture of 0 Vicryl was placed around the edges of the fascia. A Hassan trocar is introduced into the peritoneal cavity and a pneumoperitoneum created by insufflation of CO2 gas. The angled laparoscope was introduced and there is no evidence of underlying bleeding or organ injury.  A 5 mm trocar was placed in the RUQ area. Attention was directed toward the splenic flexure. Multiple polyploid and cystic lesions were noted in this area that were limited to the splenic flexure area. There was some minimal omental adhesions to the lateral abdominal wall in this area as well as to the colon.  Multiple small benign-appearing cysts were noted on the spleen.  A 5 mm trocar was placed in the left lower quadrant. A 5 mm trocar was placed in the left lateral abdominal wall just lateral to the [facility] trocar.  Beginning at the mid descending colon, I divided the lateral attachments and mobilized this up toward the splenic flexure using sharp dissection and the Harmonic scalpel. I then identified the mid transverse colon using sharp and blunt dissection dissected the omentum away from the mid transverse colon distally towards the splenic flexure. I then began mobilizing the splenic flexure by dividing its attachments to the spleen using Harmonic scalpel. I entered the lesser sac from a medial orientation and proceeded that way. I then proceeded around the splenic flexure from a lateral approach using blunt dissection and harmonic scalpel. This mobilized the splenic flexure well allowing it to be retracted to the level of the umbilicus. I then further mobilized the proximal transverse colon and distal descending colon using sharp and blunt dissection.  In the epigastric midline, a limited midline incision was made for an extraction site through all layers. A wound protection device was inserted. The splenic flexure, proximal half of transverse colon, and most of the descending colon were then externalized. Using the linear cutting stapler, I divided the transverse colon at its midpoint. Mesentery was then resected using Harmonic scalpel. I needed more mobilization of the left colon. The hand port was then placed and using hand assistance I was able to performed sharply. I then re-externalize these areas and divided the left colon close to its junction with the sigmoid colon. The rest of the mesentery was divided with  the LigaSure. The distal aspect of the left hemicolectomy specimen was marked with a suture and then handed off the field.  A side to side anastomosis was performed using the linear cutting  stapler. The staple lines were hemostatic. The common defect was closed with a linear noncutting stapler. A crotch stitch of 3-0 silk was placed. I imbricated the common defect closure with interrupted 3-0 silk sutures. The anastomosis was patent, viable, and under no tension. It was dropped back into the abdominal cavity.  The abdominal cavity was then irrigated with 1 L of warm saline and this was evacuated. There is no evidence of bleeding. Drapes instrument, gloves, gowns were all changed. 2 more liters of irrigation were then used.  There was no evidence of organ injury or bleeding noted.  The midline fascia of the extraction site incision was then closed with running #1 PDS suture. Repeat laparoscopy was performed. 4 quadrant and central inspection demonstrated no evidence of organ injury or bleeding. The fascial closure was solid. The trochars were removed.  The extraction site skin was closed with staples after irrigating the subcutaneous tissue. The trocar site incisions were closed with 4-0 Monocryl subcuticular stitches. Steri-Strips were applied to these. Sterile dressings were then applied to all wounds.   He tolerated the procedure well without any apparent complications. Estimated blood loss was 200 ml.  He was taken to the PACU in stable condition. There were no apparent complications.

## 2016-08-07 NOTE — Interval H&P Note (Signed)
History and Physical Interval Note:  08/07/2016 7:25 AM  Matthew Gross  has presented today for surgery, with the diagnosis of COLON POLYPS  The various methods of treatment have been discussed with the patient and family. After consideration of risks, benefits and other options for treatment, the patient has consented to  Procedure(s): LAPAROSCOPIC ASSISTED PARTIAL COLECTOMY (N/A) as a surgical intervention .  The patient's history has been reviewed, patient examined, no change in status, stable for surgery.  I have reviewed the patient's chart and labs.  Questions were answered to the patient's satisfaction.     Shahzaib Azevedo Lenna Sciara

## 2016-08-07 NOTE — H&P (Signed)
Matthew Gross  DOB: [birth year] Single / Language: Matthew Gross / Race: Black or African American Male  History of Present Illness  The patient is a 36 year old male.   Note:He was referred by Dr. Watt Climes for consultation regarding multiple colonic polyps/cystic type lesions around the splenic flexure. He was sent to Dr. Watt Climes because he was having some rectal bleeding. Colonoscopy demonstrated multiple cystic/polypoid lesions in the splenic flexure area. This area was marked with ink. He was sent over here to discuss resection of this area. He is here with his mother, who has power of attorney, as well as an aide that helps him and stays with him. His past medical history is notable for autism, bipolar disorder, hyperlipidemia, hypertension, vitamin D deficiency.  Of note is that his father had colon cancer at an early age and died from it.  Other Problems  Depression High blood pressure   Allergies  Penicillins  Prior to Admission medications   Medication Sig Start Date End Date Taking? Authorizing Provider  atorvastatin (LIPITOR) 10 MG tablet Take 1 tablet (10 mg total) by mouth daily. Patient taking differently: Take 10 mg by mouth at bedtime.  11/09/12  Yes Clanford Marisa Hua, MD  buPROPion (WELLBUTRIN XL) 300 MG 24 hr tablet Take 300 mg by mouth daily.    Yes Historical Provider, MD  gabapentin (NEURONTIN) 400 MG capsule Take 800 mg by mouth at bedtime.  12/04/15  Yes Historical Provider, MD  metroNIDAZOLE (FLAGYL) 500 MG tablet Will give 1 day prior to procedure 06/09/16  Yes Historical Provider, MD  neomycin (MYCIFRADIN) 500 MG tablet Will give 1 day prior to procedure 06/09/16  Yes Historical Provider, MD  risperiDONE (RISPERDAL) 1 MG tablet Take 1 mg by mouth every other day.  12/04/15  Yes Historical Provider, MD  sertraline (ZOLOFT) 100 MG tablet Take 150 mg by mouth daily. 12/04/15  Yes Historical Provider, MD  trandolapril (MAVIK) 2 MG tablet Take 1 tablet (2 mg total) by  mouth every morning. Patient taking differently: Take 2 mg by mouth daily.  11/09/12  Yes Clanford Marisa Hua, MD  benzonatate (TESSALON) 100 MG capsule Take 1 capsule (100 mg total) by mouth every 8 (eight) hours. Patient not taking: Reported on 12/21/2015 11/27/15   Marella Chimes, PA-C  fexofenadine-pseudoephedrine (ALLEGRA-D) 60-120 MG per tablet Take 1 tablet by mouth every 12 (twelve) hours. Patient not taking: Reported on 12/21/2015 02/07/13   Lanae Crumbly, PA-C  fluticasone [facility]) 50 MCG/ACT nasal spray Place 2 sprays into the nose daily. Patient not taking: Reported on 12/21/2015 02/07/13   Lanae Crumbly, PA-C     Social History  No alcohol use No caffeine use No drug use Tobacco use Never smoker.  Family History Arthritis Mother. Bleeding disorder Mother. Cancer Mother. Colon Cancer Father. Colon Polyps Mother. Hypertension Father, Mother.    Physical Exam  The physical exam findings are as follows: Note:General: WDWN in NAD. Pleasant and cooperative.  HEENT: [place]/AT, no external nasal or ear masses, mucous membranes are moist  EYES: EOMI, no scleral icterus, wears glasses  NECK: Supple, no obvious mass or thyroid mass/enlargement, no trachea deviation  CV: RRR, no murmur, no edema  CHEST: Breath sounds equal and clear. Respirations nonlabored.  ABDOMEN: Soft, nontender, nondistended, no masses, no organomegaly, active bowel sounds, no scars, no hernias.  MUSCULOSKELETAL: FROM, good muscle tone  SKIN: No suspicious rashes.  NEUROLOGIC: Alert and oriented, answers questions appropriately, normal gait and station.  PSYCHIATRIC: Appropriate mood,  affect , and behavior.    Assessment & Plan   COLON POLYPS (K63.5) Impression: Multiple polyps of undetermined etiology involving the splenic flexure. His father had colon cancer at an early age and died from it. We discussed laparoscopic assisted partial colectomy and his mother,  who is power of attorney, seems to be interested in pursuing that. We discussed the reason for that and that we wanted to know the etiology of these polyps. They understand that some of them may be precancerous or malignant.  Plan: Laparoscopic assisted partial colectomy. They want to wait until after the holidays to have the surgery. I have explained the procedure and risks of colon resection. Risks include but are not limited to bleeding, infection, wound problems, anesthesia, anastomotic leak, need for colostomy, need for reoperative surgery, injury to intraabominal organs (such as intestine, spleen, kidney, bladder, ureter, etc.), ileus, irregular bowel habits. They all seem to understand and want to proceed.  Jackolyn Confer, M.D.

## 2016-08-07 NOTE — Discharge Instructions (Addendum)
[facility], [place] [phone]  COLON SURGERY: POST OP INSTRUCTIONS  Always review your discharge instruction sheet given to you by the facility where your surgery was performed.  IF YOU HAVE DISABILITY OR FAMILY LEAVE FORMS, YOU MUST BRING THEM TO THE OFFICE FOR PROCESSING.  PLEASE DO NOT GIVE THEM TO YOUR DOCTOR.  1. A prescription for pain medication may be given to you upon discharge.  Take your pain medication as prescribed, if needed.  If narcotic pain medicine is not needed, then you may take acetaminophen (Tylenol) or ibuprofen (Advil) as needed. 2. Take your usually prescribed medications unless otherwise directed. 3. If you need a refill on your pain medication, please contact your pharmacy. They will contact our office to request authorization.  Prescriptions will not be filled after 5pm or on week-ends. 4. You should follow lean protein, lowfat diet.  Do not overeat.  Be sure to include lots of fluids daily. Most patients will experience some swelling and bruising in the area of the incision. Ice pack will help. Swelling and bruising can take several days to resolve..  5. It is common to experience some constipation if taking pain medication after surgery.  Increasing fluid intake and taking a stool softener will usually help or prevent this problem from occurring.  A mild laxative (Milk of Magnesia or Miralax) should be taken according to package directions if there are no bowel movements after 48 hours. 6.  You may have steri-strips (small skin tapes) in place directly over the incision.  These strips should be left on the skin.  If your surgeon used skin glue on the incision, you may shower in 24 hours.  The glue will flake off over the next 2-3 weeks.  Any sutures or staples will be removed at the office during your follow-up visit. You may find that a light gauze bandage over your incision may keep your staples from being rubbed or pulled. You may shower and replace  the bandage daily. 7. ACTIVITIES:  You may resume regular (light) daily activities beginning the next day--such as daily self-care, walking, climbing stairs--gradually increasing activities as tolerated.  You may have sexual intercourse when it is comfortable.  Refrain from any heavy lifting or straining for at least 6 weeks.  Do not lift anything over 10 pounds.  a. You may drive when you no longer are taking prescription pain medication, you can comfortably wear a seatbelt, and you can safely maneuver your car and apply brakes b. Return to Work: _When released to do so by doctor.__________________________________ 8. You should see your doctor in the office for a follow-up appointment approximately 2-3 weeks after your surgery.  Make sure that you call for this appointment within a day or two after you arrive home to insure a convenient appointment time. OTHER INSTRUCTIONS:  _Return to office Friday, 08/15/16 at 9:30 am for staple removal.___________________________________________________________ _____________________________________________________________  WHEN TO CALL YOUR DOCTOR: 1. Fever over 101.0 2. Inability to urinate 3. Nausea and/or vomiting 4. Extreme swelling or bruising 5. Continued bleeding from incision. 6. Increased pain, redness, or drainage from the incision.  The clinic staff is available to answer your questions during regular business hours.  Please dont hesitate to call and ask to speak to one of the nurses if you have concerns.  For further questions, please visit [email or web address]

## 2016-08-08 ENCOUNTER — Encounter (HOSPITAL_COMMUNITY): Payer: Self-pay | Admitting: General Surgery

## 2016-08-08 LAB — CBC
HCT: 39.2 % (ref 39.0–52.0)
Hemoglobin: 13.6 g/dL (ref 13.0–17.0)
MCH: 29.1 pg (ref 26.0–34.0)
MCHC: 34.7 g/dL (ref 30.0–36.0)
MCV: 83.8 fL (ref 78.0–100.0)
PLATELETS: 203 10*3/uL (ref 150–400)
RBC: 4.68 MIL/uL (ref 4.22–5.81)
RDW: 13.2 % (ref 11.5–15.5)
WBC: 11.4 10*3/uL — AB (ref 4.0–10.5)

## 2016-08-08 LAB — BASIC METABOLIC PANEL
Anion gap: 5 (ref 5–15)
BUN: 7 mg/dL (ref 6–20)
CO2: 24 mmol/L (ref 22–32)
CREATININE: 0.86 mg/dL (ref 0.61–1.24)
Calcium: 8.3 mg/dL — ABNORMAL LOW (ref 8.9–10.3)
Chloride: 107 mmol/L (ref 101–111)
Glucose, Bld: 105 mg/dL — ABNORMAL HIGH (ref 65–99)
POTASSIUM: 4 mmol/L (ref 3.5–5.1)
SODIUM: 136 mmol/L (ref 135–145)

## 2016-08-08 MED ORDER — KETOROLAC TROMETHAMINE 30 MG/ML IJ SOLN
30.0000 mg | Freq: Four times a day (QID) | INTRAMUSCULAR | Status: AC
  Administered 2016-08-08 – 2016-08-10 (×8): 30 mg via INTRAVENOUS
  Filled 2016-08-08 (×8): qty 1

## 2016-08-08 MED ORDER — METHOCARBAMOL 1000 MG/10ML IJ SOLN
500.0000 mg | Freq: Four times a day (QID) | INTRAVENOUS | Status: DC
  Administered 2016-08-08 – 2016-08-10 (×8): 500 mg via INTRAVENOUS
  Filled 2016-08-08: qty 5
  Filled 2016-08-08 (×8): qty 550

## 2016-08-08 NOTE — Progress Notes (Signed)
Assessment Principal Problem:   Colon polyposis involving splenic flexure s/p laparoscopic assisted left hemicolectomy 08/07/16-some issues with pain control otherwise doing okay.  Active Problems:   Hypertension-adequate control; Mavik restarted   Bipolar II disorder (HCC)-meds restarted.   Autistic disorder   Plan:  Add Toradol for pain.  Clear liquid diet.  Ambulate.  Foley out tomorrow per protocol.   LOS: 1 day     1 Day Post-Op  Subjective: Having incisional pain.  No nausea.  Mother in room.  Objective: Vital signs in last 24 hours: Temp:  [97.5 F (36.4 C)-98.9 F (37.2 C)] 98.7 F (37.1 C) (01/05 0605) Pulse Rate:  [79-105] 79 (01/05 0605) Resp:  [16-20] 16 (01/05 0605) BP: (114-154)/(57-86) 120/71 (01/05 0605) SpO2:  [99 %-100 %] 99 % (01/05 0605) Last BM Date:  (prior to admission)  Intake/Output from previous day: 01/04 0701 - 01/05 0700 In: 5032 [P.O.:240; I.V.:4575; IV [id number] Out: 2275 [Urine:2225; Blood:50] Intake/Output this shift: No intake/output data recorded.  PE: General- In NAD Abdomen-soft, dressings dry, hypoactive bowel sounds  Lab Results:   Recent Labs  08/07/16 1440 08/08/16 0459  WBC 11.8* 11.4*  HGB 14.6 13.6  HCT 41.7 39.2  PLT 189 203   BMET  Recent Labs  08/07/16 1440 08/08/16 0459  NA  --  136  K  --  4.0  CL  --  107  CO2  --  24  GLUCOSE  --  105*  BUN  --  7  CREATININE 0.96 0.86  CALCIUM  --  8.3*   PT/INR No results for input(s): LABPROT, INR in the last 72 hours. Comprehensive Metabolic Panel:    Component Value Date/Time   NA 136 08/08/2016 0459   NA 139 07/31/2016 0930   K 4.0 08/08/2016 0459   K 4.7 07/31/2016 0930   CL 107 08/08/2016 0459   CL 106 07/31/2016 0930   CO2 24 08/08/2016 0459   CO2 28 07/31/2016 0930   BUN 7 08/08/2016 0459   BUN 11 07/31/2016 0930   CREATININE 0.86 08/08/2016 0459   CREATININE 0.96 08/07/2016 1440   GLUCOSE 105 (H) 08/08/2016 0459   GLUCOSE 84  07/31/2016 0930   CALCIUM 8.3 (L) 08/08/2016 0459   CALCIUM 9.3 07/31/2016 0930   AST 26 07/31/2016 0930   AST 25 12/21/2015 1347   ALT 32 07/31/2016 0930   ALT 32 12/21/2015 1347   ALKPHOS 53 07/31/2016 0930   ALKPHOS 62 12/21/2015 1347   BILITOT 0.8 07/31/2016 0930   BILITOT 0.7 12/21/2015 1347   PROT 7.7 07/31/2016 0930   PROT 7.1 12/21/2015 1347   ALBUMIN 4.4 07/31/2016 0930   ALBUMIN 3.9 12/21/2015 1347     Studies/Results: No results found.  Anti-infectives: Anti-infectives    Start     Dose/Rate Route Frequency Ordered Stop   08/07/16 1600  clindamycin (CLEOCIN) IVPB 900 mg     900 mg 100 mL/hr over 30 Minutes Intravenous Every 8 hours 08/07/16 1140 08/07/16 1607   08/07/16 0600  gentamicin (GARAMYCIN) 480 mg in dextrose 5 % 100 mL IVPB     5 mg/kg  96.3 kg 112 mL/hr over 60 Minutes Intravenous 60 min pre-op 08/06/16 1303 08/07/16 0801   08/06/16 1302  clindamycin (CLEOCIN) IVPB 900 mg     900 mg 100 mL/hr over 30 Minutes Intravenous 60 min pre-op 08/06/16 1303 08/07/16 0740       Garyn Waguespack J 08/08/2016

## 2016-08-09 NOTE — Progress Notes (Signed)
Pt only dangled today and stood at bedside with help of his brother.  Pt's mother would not allow staff to walk with pt.  Pt's mother stated that her other son will be back in the morning to walk with the pt.  Mother educated on importance of ambulation for pt's recovery.

## 2016-08-09 NOTE — Progress Notes (Signed)
  General Surgery [facility]  Assessment & Plan:  Status post lap left colectomy for polyposis - POD#2  Clear liquid diet - mother would like to continue today  Encouraged OOB, ambulation  Foley out, urinating  Pain controlled        Earnstine Regal, MD, [facility]       Office: [phone]    Subjective: Patient in bed, quiet.  Mother at bedside  Objective: Vital signs in last 24 hours: Temp:  [98.6 F (37 C)-99 F (37.2 C)] 99 F (37.2 C) (01/06 0510) Pulse Rate:  [60-89] 61 (01/06 0510) Resp:  [17-18] 18 (01/06 0510) BP: (118-142)/(68-75) 118/72 (01/06 0510) SpO2:  [97 %-100 %] 100 % (01/06 0510) Last BM Date:  (prior to admission)  Intake/Output from previous day: 01/05 0701 - 01/06 0700 In: 2923.7 [P.O.:240; I.V.:2463.7; IV Piggyback:220] Out: 1350 [id number] Intake/Output this shift: No intake/output data recorded.  Physical Exam: HEENT - sclerae clear, mucous membranes moist Neck - soft Chest - clear bilaterally Cor - RRR Abdomen - soft, non-distended; quiet to auscultation; dressings dry and intact  Lab Results:   Recent Labs  08/07/16 1440 08/08/16 0459  WBC 11.8* 11.4*  HGB 14.6 13.6  HCT 41.7 39.2  PLT 189 203   BMET  Recent Labs  08/07/16 1440 08/08/16 0459  NA  --  136  K  --  4.0  CL  --  107  CO2  --  24  GLUCOSE  --  105*  BUN  --  7  CREATININE 0.96 0.86  CALCIUM  --  8.3*   PT/INR No results for input(s): LABPROT, INR in the last 72 hours. Comprehensive Metabolic Panel:    Component Value Date/Time   NA 136 08/08/2016 0459   NA 139 07/31/2016 0930   K 4.0 08/08/2016 0459   K 4.7 07/31/2016 0930   CL 107 08/08/2016 0459   CL 106 07/31/2016 0930   CO2 24 08/08/2016 0459   CO2 28 07/31/2016 0930   BUN 7 08/08/2016 0459   BUN 11 07/31/2016 0930   CREATININE 0.86 08/08/2016 0459   CREATININE 0.96 08/07/2016 1440   GLUCOSE 105 (H) 08/08/2016 0459   GLUCOSE 84  07/31/2016 0930   CALCIUM 8.3 (L) 08/08/2016 0459   CALCIUM 9.3 07/31/2016 0930   AST 26 07/31/2016 0930   AST 25 12/21/2015 1347   ALT 32 07/31/2016 0930   ALT 32 12/21/2015 1347   ALKPHOS 53 07/31/2016 0930   ALKPHOS 62 12/21/2015 1347   BILITOT 0.8 07/31/2016 0930   BILITOT 0.7 12/21/2015 1347   PROT 7.7 07/31/2016 0930   PROT 7.1 12/21/2015 1347   ALBUMIN 4.4 07/31/2016 0930   ALBUMIN 3.9 12/21/2015 1347    Studies/Results: No results found.    Matthew Gross 08/09/2016  Patient ID: Matthew Gross, male   DOB: 09-10-1980, 36 y.o.   MRN: [id number]

## 2016-08-10 MED ORDER — DIPHENHYDRAMINE HCL 25 MG PO CAPS
25.0000 mg | ORAL_CAPSULE | Freq: Four times a day (QID) | ORAL | Status: DC | PRN
  Administered 2016-08-10: 25 mg via ORAL
  Filled 2016-08-10: qty 1

## 2016-08-10 MED ORDER — PANTOPRAZOLE SODIUM 40 MG PO TBEC
40.0000 mg | DELAYED_RELEASE_TABLET | Freq: Every day | ORAL | Status: DC
  Administered 2016-08-10 – 2016-08-12 (×3): 40 mg via ORAL
  Filled 2016-08-10 (×3): qty 1

## 2016-08-10 NOTE — Progress Notes (Signed)
PHARMACIST - PHYSICIAN COMMUNICATION  DR:   CCS  CONCERNING: IV to Oral Route Change Policy  RECOMMENDATION: This patient is receiving Protonix by the intravenous route.  Based on criteria approved by the Pharmacy and Therapeutics Committee, the intravenous medication(s) is/are being converted to the equivalent oral dose form(s).   DESCRIPTION: These criteria include:  The patient is eating (either orally or via tube) and/or has been taking other orally administered medications for a least 24 hours  The patient has no evidence of active gastrointestinal bleeding or impaired GI absorption (gastrectomy, short bowel, patient on TNA or NPO).  If you have questions about this conversion, please contact the Pharmacy Department  []   [phone] )  [facility] []   [phone] )  [facility] [place] []   [phone] )  [facility] []   [phone] )  [facility] [x]   [phone] )  [facility]    08/10/2016 8:47 AM  Peggyann Juba, PharmD, BCPS Pager: [phone]

## 2016-08-10 NOTE — Progress Notes (Signed)
  General Surgery [facility]  Assessment & Plan:  Status post lap left colectomy for polyposis - POD#3             Advance to regular diet today             Encouraged OOB, ambulation in halls             Pain controlled        Earnstine Regal, MD, [facility]       Office: [phone]    Subjective: Patient up to bedside commode, mother in room.  Tolerating clear liquids.  Did not ambulate yesterday.  Objective: Vital signs in last 24 hours: Temp:  [98.8 F (37.1 C)-99 F (37.2 C)] 99 F (37.2 C) (01/07 0444) Pulse Rate:  [72-80] 77 (01/07 0444) Resp:  [18] 18 (01/07 0444) BP: (117-124)/(64-68) 119/68 (01/07 0444) SpO2:  [95 %-98 %] 95 % (01/07 0444) Last BM Date:  (prior to admission)  Intake/Output from previous day: 01/06 0701 - 01/07 0700 In: 2091.7 [P.O.:800; I.V.:1181.7; IV Piggyback:110] Out: 1875 [Urine:1875] Intake/Output this shift: No intake/output data recorded.  Physical Exam: HEENT - sclerae clear, mucous membranes moist Abdomen - soft without distension; dressings dry and intact Ext - no edema, non-tender  Lab Results:   Recent Labs  08/07/16 1440 08/08/16 0459  WBC 11.8* 11.4*  HGB 14.6 13.6  HCT 41.7 39.2  PLT 189 203   BMET  Recent Labs  08/07/16 1440 08/08/16 0459  NA  --  136  K  --  4.0  CL  --  107  CO2  --  24  GLUCOSE  --  105*  BUN  --  7  CREATININE 0.96 0.86  CALCIUM  --  8.3*   PT/INR No results for input(s): LABPROT, INR in the last 72 hours. Comprehensive Metabolic Panel:    Component Value Date/Time   NA 136 08/08/2016 0459   NA 139 07/31/2016 0930   K 4.0 08/08/2016 0459   K 4.7 07/31/2016 0930   CL 107 08/08/2016 0459   CL 106 07/31/2016 0930   CO2 24 08/08/2016 0459   CO2 28 07/31/2016 0930   BUN 7 08/08/2016 0459   BUN 11 07/31/2016 0930   CREATININE 0.86 08/08/2016 0459   CREATININE 0.96 08/07/2016 1440   GLUCOSE 105 (H) 08/08/2016 0459   GLUCOSE 84 07/31/2016 0930   CALCIUM 8.3 (L) 08/08/2016 0459   CALCIUM 9.3 07/31/2016 0930   AST 26 07/31/2016 0930   AST 25 12/21/2015 1347   ALT 32 07/31/2016 0930   ALT 32 12/21/2015 1347   ALKPHOS 53 07/31/2016 0930   ALKPHOS 62 12/21/2015 1347   BILITOT 0.8 07/31/2016 0930   BILITOT 0.7 12/21/2015 1347   PROT 7.7 07/31/2016 0930   PROT 7.1 12/21/2015 1347   ALBUMIN 4.4 07/31/2016 0930   ALBUMIN 3.9 12/21/2015 1347    Studies/Results: No results found.    Matthew Gross M 08/10/2016  Patient ID: Matthew Gross, male   DOB: [birth year], 36 y.o.   MRN: [id number]

## 2016-08-11 MED ORDER — OXYCODONE HCL 5 MG PO TABS
5.0000 mg | ORAL_TABLET | ORAL | Status: DC | PRN
  Administered 2016-08-11 – 2016-08-12 (×2): 5 mg via ORAL
  Filled 2016-08-11 (×2): qty 1

## 2016-08-11 NOTE — Progress Notes (Signed)
Assessment Principal Problem:   Colon polyposis involving splenic flexure s/p laparoscopic assisted left hemicolectomy 08/07/16-progressing well  Active Problems:   Hypertension-good control; Mavik restarted   Bipolar II disorder (HCC)-meds restarted.   Autistic disorder   Plan:  Heplock IV.  Hopefully home tomorrow if he is eating better.   LOS: 4 days     4 Days Post-Op  Subjective: Still having some incisional pain.  Had an allergic reaction (rash) to IV Robaxin.  Having loose BMs.  Mother in room. Not eating much.  Objective: Vital signs in last 24 hours: Temp:  [98.2 F (36.8 C)-98.8 F (37.1 C)] 98.3 F (36.8 C) (01/08 0524) Pulse Rate:  [77-92] 77 (01/08 0524) Resp:  [18] 18 (01/08 0524) BP: (112-129)/(65-69) 123/65 (01/08 0524) SpO2:  [97 %-100 %] 98 % (01/08 0524) Last BM Date: 08/10/16  Intake/Output from previous day: 01/07 0701 - 01/08 0700 In: 1935 [P.O.:920; I.V.:1015] Out: 660 [Urine:660] Intake/Output this shift: No intake/output data recorded.  PE: General- In NAD Abdomen-soft, incisions are clean and intact  Lab Results:  No results for input(s): WBC, HGB, HCT, PLT in the last 72 hours. BMET No results for input(s): NA, K, CL, CO2, GLUCOSE, BUN, CREATININE, CALCIUM in the last 72 hours. PT/INR No results for input(s): LABPROT, INR in the last 72 hours. Comprehensive Metabolic Panel:    Component Value Date/Time   NA 136 08/08/2016 0459   NA 139 07/31/2016 0930   K 4.0 08/08/2016 0459   K 4.7 07/31/2016 0930   CL 107 08/08/2016 0459   CL 106 07/31/2016 0930   CO2 24 08/08/2016 0459   CO2 28 07/31/2016 0930   BUN 7 08/08/2016 0459   BUN 11 07/31/2016 0930   CREATININE 0.86 08/08/2016 0459   CREATININE 0.96 08/07/2016 1440   GLUCOSE 105 (H) 08/08/2016 0459   GLUCOSE 84 07/31/2016 0930   CALCIUM 8.3 (L) 08/08/2016 0459   CALCIUM 9.3 07/31/2016 0930   AST 26 07/31/2016 0930   AST 25 12/21/2015 1347   ALT 32 07/31/2016 0930   ALT 32  12/21/2015 1347   ALKPHOS 53 07/31/2016 0930   ALKPHOS 62 12/21/2015 1347   BILITOT 0.8 07/31/2016 0930   BILITOT 0.7 12/21/2015 1347   PROT 7.7 07/31/2016 0930   PROT 7.1 12/21/2015 1347   ALBUMIN 4.4 07/31/2016 0930   ALBUMIN 3.9 12/21/2015 1347     Studies/Results: No results found.  Anti-infectives: Anti-infectives    Start     Dose/Rate Route Frequency Ordered Stop   08/07/16 1600  clindamycin (CLEOCIN) IVPB 900 mg     900 mg 100 mL/hr over 30 Minutes Intravenous Every 8 hours 08/07/16 1140 08/07/16 1607   08/07/16 0600  gentamicin (GARAMYCIN) 480 mg in dextrose 5 % 100 mL IVPB     5 mg/kg  96.3 kg 112 mL/hr over 60 Minutes Intravenous 60 min pre-op 08/06/16 1303 08/07/16 0801   08/06/16 1302  clindamycin (CLEOCIN) IVPB 900 mg     900 mg 100 mL/hr over 30 Minutes Intravenous 60 min pre-op 08/06/16 1303 08/07/16 0740       Raylin Winer J 08/11/2016

## 2016-08-12 MED ORDER — OXYCODONE HCL 5 MG PO TABS: 5.0000 mg | ORAL_TABLET | ORAL | 0 refills | Status: DC | PRN

## 2016-08-12 MED FILL — oxyCODONE HCL 5 MG TABS: 5 | 4 days supply | Qty: 40 | Fill #0

## 2016-08-12 NOTE — Progress Notes (Signed)
Assessment Principal Problem:   Colon polyposis involving splenic flexure s/p laparoscopic assisted left hemicolectomy 08/07/16- continues to do well; pathology is pending.  Active Problems:   Hypertension-good control; Mavik restarted   Bipolar II disorder (HCC)-meds restarted.   Autistic disorder   Plan:  Discharge today.  Instructions given to him and his family.   LOS: 5 days     5 Days Post-Op  Subjective: Tolerating diet better.  Ready to go home.  Objective: Vital signs in last 24 hours: Temp:  [98.2 F (36.8 C)-99.5 F (37.5 C)] 98.2 F (36.8 C) (01/09 0510) Pulse Rate:  [79-91] 84 (01/09 0510) Resp:  [16-18] 16 (01/09 0510) BP: (117-125)/(67-74) 125/72 (01/09 0510) SpO2:  [98 %] 98 % (01/09 0510) Last BM Date: 08/11/16  Intake/Output from previous day: 01/08 0701 - 01/09 0700 In: 360 [P.O.:360] Out: -  Intake/Output this shift: No intake/output data recorded.  PE: General- In NAD Abdomen-soft, incisions are clean and intact  Lab Results:  No results for input(s): WBC, HGB, HCT, PLT in the last 72 hours. BMET No results for input(s): NA, K, CL, CO2, GLUCOSE, BUN, CREATININE, CALCIUM in the last 72 hours. PT/INR No results for input(s): LABPROT, INR in the last 72 hours. Comprehensive Metabolic Panel:    Component Value Date/Time   NA 136 08/08/2016 0459   NA 139 07/31/2016 0930   K 4.0 08/08/2016 0459   K 4.7 07/31/2016 0930   CL 107 08/08/2016 0459   CL 106 07/31/2016 0930   CO2 24 08/08/2016 0459   CO2 28 07/31/2016 0930   BUN 7 08/08/2016 0459   BUN 11 07/31/2016 0930   CREATININE 0.86 08/08/2016 0459   CREATININE 0.96 08/07/2016 1440   GLUCOSE 105 (H) 08/08/2016 0459   GLUCOSE 84 07/31/2016 0930   CALCIUM 8.3 (L) 08/08/2016 0459   CALCIUM 9.3 07/31/2016 0930   AST 26 07/31/2016 0930   AST 25 12/21/2015 1347   ALT 32 07/31/2016 0930   ALT 32 12/21/2015 1347   ALKPHOS 53 07/31/2016 0930   ALKPHOS 62 12/21/2015 1347   BILITOT 0.8  07/31/2016 0930   BILITOT 0.7 12/21/2015 1347   PROT 7.7 07/31/2016 0930   PROT 7.1 12/21/2015 1347   ALBUMIN 4.4 07/31/2016 0930   ALBUMIN 3.9 12/21/2015 1347     Studies/Results: No results found.  Anti-infectives: Anti-infectives    Start     Dose/Rate Route Frequency Ordered Stop   08/07/16 1600  clindamycin (CLEOCIN) IVPB 900 mg     900 mg 100 mL/hr over 30 Minutes Intravenous Every 8 hours 08/07/16 1140 08/07/16 1607   08/07/16 0600  gentamicin (GARAMYCIN) 480 mg in dextrose 5 % 100 mL IVPB     5 mg/kg  96.3 kg 112 mL/hr over 60 Minutes Intravenous 60 min pre-op 08/06/16 1303 08/07/16 0801   08/06/16 1302  clindamycin (CLEOCIN) IVPB 900 mg     900 mg 100 mL/hr over 30 Minutes Intravenous 60 min pre-op 08/06/16 1303 08/07/16 0740       Aidee Latimore J 08/12/2016

## 2016-08-14 NOTE — Discharge Summary (Signed)
Physician Discharge Summary  Patient ID: Matthew Gross MRN: [id number] DOB/AGE: 02-Jan-1981 36 y.o.  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  He was placed on the postop colorectal surgery pathway and progressed well.  He was able to be discharged on 08/12/16.  There were no complications.  Discharge instructions were given to him and his family.  Final pathology was benign (as above).   Discharge Exam: Blood pressure 125/72, pulse 84, temperature 98.2 F (36.8 C), temperature source Oral, resp. rate 16, height 6' 0.5" (1.842 m), weight 96.3 kg (212 lb 6.4 oz), SpO2 98 %.   Disposition: 01-Home or Self Care   Allergies as of 08/12/2016      Reactions   Penicillins Hives   Has patient had a PCN reaction causing immediate rash, facial/tongue/throat swelling, SOB or lightheadedness with hypotension: Yes Has patient had a PCN reaction causing severe rash involving mucus membranes or skin necrosis: No Has patient had a PCN reaction that required hospitalization No Has patient had a PCN reaction occurring within the last 10 years: Yes If all of the above answers are "NO", then may proceed with Cephalosporin use.   Robaxin [methocarbamol] Rash      Medication List    TAKE these medications   atorvastatin 10 MG tablet Commonly known as:  LIPITOR Take 1 tablet (10 mg total) by mouth daily. What changed:  when to take this   benzonatate 100 MG capsule Commonly known as:  TESSALON Take 1 capsule (100 mg total) by mouth every 8 (eight) hours.   buPROPion 300 MG 24 hr  tablet Commonly known as:  WELLBUTRIN XL Take 300 mg by mouth daily.   fexofenadine-pseudoephedrine 60-120 MG 12 hr tablet Commonly known as:  ALLEGRA-D Take 1 tablet by mouth every 12 (twelve) hours.   fluticasone 50 MCG/ACT nasal spray Commonly known as:  FLONASE Place 2 sprays into the nose daily.   gabapentin 400 MG capsule Commonly known as:  NEURONTIN Take 800 mg by mouth at bedtime.   metroNIDAZOLE 500 MG tablet Commonly known as:  FLAGYL Will give 1 day prior to procedure   neomycin 500 MG tablet Commonly known as:  MYCIFRADIN Will give 1 day prior to procedure   oxyCODONE 5 MG immediate release tablet Commonly known as:  Oxy IR/ROXICODONE Take 1-2 tablets (5-10 mg total) by mouth every 4 (four) hours as needed for moderate pain.   risperiDONE 1 MG tablet Commonly known as:  RISPERDAL Take 1 mg by mouth every other day.   sertraline 100 MG tablet Commonly known as:  ZOLOFT Take 150 mg by mouth daily.   trandolapril 2 MG tablet Commonly known as:  MAVIK Take 1 tablet (2 mg total) by mouth every morning. What changed:  when to take this        Signed: Regino Fournet J 08/14/2016, 8:58 AM

## 2016-08-31 ENCOUNTER — Encounter (HOSPITAL_COMMUNITY): Payer: Self-pay | Admitting: Emergency Medicine

## 2016-08-31 ENCOUNTER — Emergency Department (HOSPITAL_COMMUNITY)
Admission: EM | Admit: 2016-08-31 | Discharge: 2016-08-31 | Disposition: A | Discharge status: Home / Self Care | Payer: Medicaid Other | Attending: Emergency Medicine | Admitting: Emergency Medicine

## 2016-08-31 DIAGNOSIS — K625 Hemorrhage of anus and rectum: Secondary | ICD-10-CM | POA: Insufficient documentation

## 2016-08-31 DIAGNOSIS — F840 Autistic disorder: Secondary | ICD-10-CM | POA: Insufficient documentation

## 2016-08-31 DIAGNOSIS — F909 Attention-deficit hyperactivity disorder, unspecified type: Secondary | ICD-10-CM | POA: Insufficient documentation

## 2016-08-31 DIAGNOSIS — I10 Essential (primary) hypertension: Secondary | ICD-10-CM | POA: Diagnosis not present

## 2016-08-31 DIAGNOSIS — Z79899 Other long term (current) drug therapy: Secondary | ICD-10-CM | POA: Insufficient documentation

## 2016-08-31 LAB — COMPREHENSIVE METABOLIC PANEL
ALT: 25 U/L (ref 17–63)
AST: 23 U/L (ref 15–41)
Albumin: 4.4 g/dL (ref 3.5–5.0)
Alkaline Phosphatase: 53 U/L (ref 38–126)
Anion gap: 7 (ref 5–15)
BUN: 9 mg/dL (ref 6–20)
CO2: 24 mmol/L (ref 22–32)
Calcium: 9.6 mg/dL (ref 8.9–10.3)
Chloride: 106 mmol/L (ref 101–111)
Creatinine, Ser: 1.17 mg/dL (ref 0.61–1.24)
GFR calc Af Amer: >60 mL/min (ref >60)
GFR calc non Af Amer: >60 mL/min (ref >60)
Glucose, Bld: 87 mg/dL (ref 65–99)
Potassium: 4.7 mmol/L (ref 3.5–5.1)
Sodium: 137 mmol/L (ref 135–145)
Total Bilirubin: 1 mg/dL (ref 0.3–1.2)
Total Protein: 8.4 g/dL — ABNORMAL HIGH (ref 6.5–8.1)

## 2016-08-31 LAB — CBC
HCT: 40.6 % (ref 39.0–52.0)
Hemoglobin: 14.3 g/dL (ref 13.0–17.0)
MCH: 28.5 pg (ref 26.0–34.0)
MCHC: 35.2 g/dL (ref 30.0–36.0)
MCV: 81 fL (ref 78.0–100.0)
Platelets: 130 10*3/uL — ABNORMAL LOW (ref 150–400)
RBC: 5.01 MIL/uL (ref 4.22–5.81)
RDW: 12.4 % (ref 11.5–15.5)
WBC: 4.7 10*3/uL (ref 4.0–10.5)

## 2016-08-31 LAB — TYPE AND SCREEN
ABO/RH(D): O POS
Antibody Screen: NEGATIVE

## 2016-08-31 LAB — POC OCCULT BLOOD, ED: Fecal Occult Bld: POSITIVE — AB

## 2016-08-31 NOTE — Discharge Instructions (Signed)
He may continue to have some rectal bleeding over the next 1 or 2 days.  Continue giving MiraLAX daily until he has daily soft bowel movements at least once.  Make sure he is drinking plenty of fluids, and eating a high-fiber diet.  Return here if needed for pain, or if you are concerned about worsening rectal bleeding.

## 2016-08-31 NOTE — ED Provider Notes (Signed)
[facility] DEPT Provider Note   CSN: [id number] Arrival date & time: 08/31/16  1001     History   Chief Complaint No chief complaint on file.   HPI Matthew Gross is a 36 y.o. male.  He presents for evaluation of rectal bleeding, which occurred this morning. He did not have a bowel movement yesterday, so his mother gave him MiraLAX. This morning the bowel movement was small but had blood mixed with it. He has been stooling daily up until yesterday following his recent Hemicolectomy, for complicated polyps, earlier this month. There's been no fever, chills, nausea, vomiting, weakness or dizziness. His mother gives the history. There are no other known modifying factors.  HPI  Past Medical History:  Diagnosis Date  . Autistic disorder PT GOES TO A DAY PROGRAM DAILY AND HAS ASSISTANCE AT HOME IN THE EVERY   PT MOTHER LEGAL [place]  . Colon polyps   . 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 Hyperlipidemia   . Hypertension   . Penile cyst   . Rectal bleeding 2017   before and colonscopy    Patient Active Problem List   Diagnosis Date Noted  . Colon polyposis involving splenic flexure s/p laparoscopic assisted left hemicolectomy 08/07/16 08/07/2016  . Hypertension 11/09/2012  . Bipolar II disorder ([place]) 11/09/2012  . Hyperlipidemia 11/09/2012  . Autistic disorder 11/09/2012  . ATTENTION DEFICIT, W/HYPERACTIVITY 10/01/2006  . MENTAL RETARDATION 10/01/2006  . RHINITIS, ALLERGIC 10/01/2006    Past Surgical History:  Procedure Laterality Date  . CIRCUMCISION  1990'S  . colonscopy  2017  . cyst removed from abdomen  2013  . LAPAROSCOPIC PARTIAL COLECTOMY N/A 08/07/2016   Procedure: LAPAROSCOPIC HAND ASSISTED LEFT HEMICOLECTOMY WITH SPLENIC FLEXURE MOBILIZATION;  Surgeon: Jackolyn Confer, MD;  Location: [facility] ORS;  Service: General;  Laterality: N/A;       Home Medications    Prior to Admission medications   Medication Sig Start Date End Date Taking? Authorizing Provider    atorvastatin (LIPITOR) 10 MG tablet Take 1 tablet (10 mg total) by mouth daily. Patient taking differently: Take 10 mg by mouth at bedtime.  11/09/12   Clanford Marisa Hua, MD  benzonatate (TESSALON) 100 MG capsule Take 1 capsule (100 mg total) by mouth every 8 (eight) hours. Patient not taking: Reported on 12/21/2015 11/27/15   Marella Chimes, PA-C  buPROPion (WELLBUTRIN XL) 300 MG 24 hr tablet Take 300 mg by mouth daily.     Historical Provider, MD  fexofenadine-pseudoephedrine (ALLEGRA-D) 60-120 MG per tablet Take 1 tablet by mouth every 12 (twelve) hours. Patient not taking: Reported on 12/21/2015 02/07/13   Lanae Crumbly, PA-C  fluticasone [facility]) 50 MCG/ACT nasal spray Place 2 sprays into the nose daily. Patient not taking: Reported on 12/21/2015 02/07/13   Lanae Crumbly, PA-C  gabapentin (NEURONTIN) 400 MG capsule Take 800 mg by mouth at bedtime.  12/04/15   Historical Provider, MD  metroNIDAZOLE (FLAGYL) 500 MG tablet Will give 1 day prior to procedure 06/09/16   Historical Provider, MD  neomycin (MYCIFRADIN) 500 MG tablet Will give 1 day prior to procedure 06/09/16   Historical Provider, MD  oxyCODONE (OXY IR/ROXICODONE) 5 MG immediate release tablet Take 1-2 tablets (5-10 mg total) by mouth every 4 (four) hours as needed for moderate pain. 08/12/16   Jackolyn Confer, MD  risperiDONE (RISPERDAL) 1 MG tablet Take 1 mg by mouth every other day.  12/04/15   Historical Provider, MD  sertraline (ZOLOFT) 100 MG tablet  Take 150 mg by mouth daily. 12/04/15   Historical Provider, MD  trandolapril (MAVIK) 2 MG tablet Take 1 tablet (2 mg total) by mouth every morning. Patient taking differently: Take 2 mg by mouth daily.  11/09/12   Clanford Marisa Hua, MD    Family History Family History  Problem Relation Age of Onset  . Heart failure Mother   . Hypertension Mother   . Hyperlipidemia Mother     Social History Social History  Substance Use Topics  . Smoking status: Never Smoker  . Smokeless tobacco:  Never Used  . Alcohol use No     Allergies   Penicillins and Robaxin [methocarbamol]   Review of Systems Review of Systems  All other systems reviewed and are negative.    Physical Exam Updated Vital Signs BP 130/96 (BP Location: Right Arm)   Pulse 72   Temp 98.4 F (36.9 C) (Oral)   Resp 17   SpO2 100%   Physical Exam  Constitutional: He appears well-developed and well-nourished. No distress.  HENT:  Head: Normocephalic and atraumatic.  Right Ear: External ear normal.  Left Ear: External ear normal.  Eyes: Conjunctivae and EOM are normal. Pupils are equal, round, and reactive to light.  Neck: Normal range of motion and phonation normal. Neck supple.  Cardiovascular: Normal rate, regular rhythm and normal heart sounds.   Pulmonary/Chest: Effort normal and breath sounds normal. He exhibits no bony tenderness.  Abdominal: Soft. There is no tenderness.  Healing abdominal wounds, with Steri-Strips present. Mild guarding on examination, but the patient is laughing while I do this, and the mother states, "he is ticklish". No drainage or bleeding from the wounds. Wound edges appear well approximated.  Genitourinary:  Genitourinary Comments: Anus is normal externally. There is no evident anal fissure, external hemorrhoids, or blood in the perineum. Digital rectal examination reveals small amount of brown stool, and a normal prostate.  Musculoskeletal: Normal range of motion.  Neurological: He is alert. No cranial nerve deficit or sensory deficit. He exhibits normal muscle tone. Coordination normal.  Skin: Skin is warm, dry and intact.  Psychiatric: He has a normal mood and affect. His behavior is normal.  Nursing note and vitals reviewed.    ED Treatments / Results  Labs (all labs ordered are listed, but only abnormal results are displayed) Labs Reviewed  COMPREHENSIVE METABOLIC PANEL - Abnormal; Notable for the following:       Result Value   Total Protein 8.4 (*)    All  other components within normal limits  CBC - Abnormal; Notable for the following:    Platelets 130 (*)    All other components within normal limits  POC OCCULT BLOOD, ED - Abnormal; Notable for the following:    Fecal Occult Bld POSITIVE (*)    All other components within normal limits  TYPE AND SCREEN    EKG  EKG Interpretation None       Radiology No results found.  Procedures Procedures (including critical care time)  Medications Ordered in ED Medications - No data to display   Initial Impression / Assessment and Plan / ED Course  I have reviewed the triage vital signs and the nursing notes.  Pertinent labs & imaging results that were available during my care of the patient were reviewed by me and considered in my medical decision making (see chart for details).     Medications - No data to display  Patient Vitals for the past 24 hrs:  BP Temp  Temp src Pulse Resp SpO2  08/31/16 1248 130/96 - - 72 17 100 %  08/31/16 1016 128/86 98.4 F (36.9 C) Oral 80 16 100 %    12:43 PM Reevaluation with update and discussion. After initial assessment and treatment, an updated evaluation reveals He remains comfortable. There are no further complaints. Findings discussed with mother, all questions answered. Shequita Peplinski L    Final Clinical Impressions(s) / ED Diagnoses   Final diagnoses:  Rectal bleeding    Rectal bleeding, with normal hemoglobin, and vital signs. Suspect postoperative bleeding, which is not likely to be serious or persist for long. Most likely disrupted wound clot. No fecal impaction or suspected significant constipation.  Nursing Notes Reviewed/ Care Coordinated Applicable Imaging Reviewed Interpretation of Laboratory Data incorporated into ED treatment  The patient appears reasonably screened and/or stabilized for discharge and I doubt any other medical condition or other [facility] requiring further screening, evaluation, or treatment in the ED at this time  prior to discharge.  Plan: Home Medications- continue; Home Treatments- rest; return here if the recommended treatment, does not improve the symptoms; Recommended follow up- PCP prn   New Prescriptions Discharge Medication List as of 08/31/2016 12:38 PM       Daleen Bo, MD 08/31/16 1657

## 2016-08-31 NOTE — ED Triage Notes (Signed)
Pt c/o 2 episodes copious bright blood in stool onset today 0400 today. January 4th bowel surgery, hospitalized through 9th. No abdominal pain, emesis, dizziness, weakness. No anticoagulants.

## 2016-08-31 NOTE — ED Notes (Signed)
RN is starting line and collecting bloodwork 

## 2016-09-04 MED FILL — SERTRALINE HCL 100 MG TAB: 100 | 30 days supply | Qty: 45 | Fill #4

## 2016-09-04 MED FILL — risperiDONE 1 MG TABS: 1 | 30 days supply | Qty: 30 | Fill #4

## 2016-09-04 MED FILL — GABAPENTIN 400 MG CAPSULE: 400 | 30 days supply | Qty: 90 | Fill #2

## 2016-09-04 MED FILL — BUPROPION HCL XL 300 MG TAB: 300 | 30 days supply | Qty: 30 | Fill #4

## 2016-09-04 MED FILL — TRANDOLAPRIL 2 MG TABLET: 2 | 30 days supply | Qty: 30 | Fill #5

## 2016-09-24 MED FILL — ATORVASTATIN 10 MG TABLET: 10 | 90 days supply | Qty: 90 | Fill #0

## 2016-10-01 MED FILL — risperiDONE 1 MG TABS: 1 | 30 days supply | Qty: 30 | Fill #5

## 2016-10-01 MED FILL — BUPROPION HCL XL 300 MG TAB: 300 | 30 days supply | Qty: 30 | Fill #0

## 2016-10-01 MED FILL — SERTRALINE HCL 100 MG TAB: 100 | 30 days supply | Qty: 45 | Fill #0

## 2016-10-02 MED FILL — GABAPENTIN 400 MG CAPSULE: 400 | 30 days supply | Qty: 90 | Fill #3

## 2016-10-02 MED FILL — TRANDOLAPRIL 2 MG TABLET: 2 | 90 days supply | Qty: 90 | Fill #0

## 2016-11-03 MED FILL — BUPROPION HCL XL 300 MG TAB: 300 | 30 days supply | Qty: 30 | Fill #1

## 2016-11-03 MED FILL — risperiDONE 1 MG TABS: 1 | 30 days supply | Qty: 30 | Fill #0

## 2016-11-03 MED FILL — GABAPENTIN 400 MG CAPSULE: 400 | 30 days supply | Qty: 90 | Fill #4

## 2016-11-03 MED FILL — SERTRALINE HCL 100 MG TAB: 100 | 30 days supply | Qty: 45 | Fill #1

## 2016-12-03 MED FILL — risperiDONE 1 MG TABS: 1 | 30 days supply | Qty: 30 | Fill #1

## 2016-12-03 MED FILL — GABAPENTIN 400 MG CAPSULE: 400 | 30 days supply | Qty: 90 | Fill #5

## 2016-12-03 MED FILL — BUPROPION HCL XL 300 MG TAB: 300 | 30 days supply | Qty: 30 | Fill #2

## 2016-12-03 MED FILL — SERTRALINE HCL 100 MG TAB: 100 | 30 days supply | Qty: 45 | Fill #2

## 2016-12-30 ENCOUNTER — Ambulatory Visit (INDEPENDENT_AMBULATORY_CARE_PROVIDER_SITE_OTHER): Payer: Medicaid Other | Admitting: Podiatry

## 2016-12-30 ENCOUNTER — Encounter: Payer: Self-pay | Admitting: Podiatry

## 2016-12-30 DIAGNOSIS — M7750 Other enthesopathy of unspecified foot: Secondary | ICD-10-CM

## 2016-12-30 DIAGNOSIS — L600 Ingrowing nail: Secondary | ICD-10-CM | POA: Diagnosis not present

## 2016-12-30 NOTE — Progress Notes (Signed)
SUBJECTIVE: 36 y.o. year old male accompanied by his mother for ingrown nail. Patient is Autistic mother spoke for him. Mother stated that left big toe nail bled a couple of weeks ago. She kept it clean and took care of it. She also complain that his 5th toe nail is deformed and cause the nail to split. Now mother wants to have the problem toes corrected.  REVIEW OF SYSTEMS: A comprehensive review of systems was negative except for: Recent colon surgery for polyps in January 2018, has Bipolar, Autistic and depression condition. Partial colon removal due to cystic polyp in January 2018. Autistic, depression, Bipolar,   OBJECTIVE: DERMATOLOGIC EXAMINATION: Painful deformed nails 5th bilateral. Ingrown nail left great toe medial border.  VASCULAR EXAMINATION OF LOWER LIMBS: Pedal pulses: All pedal pulses are palpable with normal pulsation.  Capillary Filling times within 3 seconds in all digits.  Temperature gradient from tibial crest to dorsum of foot is within normal bilateral.  NEUROLOGIC EXAMINATION OF THE LOWER LIMBS: All epicritic and tactile sensations grossly intact. Sharp and Dull discriminatory sensations at the plantar ball of hallux is intact bilateral.   MUSCULOSKELETAL EXAMINATION: Positive for deformed 5th toe with splitting nail at lateral border. Flat foot with bunion deformity bilateral.   ASSESSMENT: Ingrown nail left great toe medial border. Deformed nail 5th toe bilateral. Phalangeal spur 5th toe bilateral.  PLAN: Reviewed clinical findings and available treatment options. As per request all nails affected debrided. Surgery consent form reviewed for ingrown nail surgery left great toe and excision of deformed nail and phalangeal bone 5th toe bilateral.

## 2016-12-30 NOTE — Patient Instructions (Signed)
Seen for ingrown nails. All affected nails debrided. Reviewed pre op consent form as per request.

## 2017-01-01 MED FILL — ATORVASTATIN 10 MG TABLET: 10 | 90 days supply | Qty: 90 | Fill #1

## 2017-01-01 MED FILL — BUPROPION HCL XL 300 MG TAB: 300 | 30 days supply | Qty: 30 | Fill #3

## 2017-01-01 MED FILL — SERTRALINE HCL 100 MG TAB: 100 | 30 days supply | Qty: 45 | Fill #3

## 2017-01-01 MED FILL — risperiDONE 1 MG TABS: 1 | 30 days supply | Qty: 30 | Fill #2

## 2017-01-01 MED FILL — GABAPENTIN 400 MG CAPSULE: 400 | 30 days supply | Qty: 90 | Fill #0

## 2017-01-01 MED FILL — TRANDOLAPRIL 2 MG TAB: 2 | 30 days supply | Qty: 30 | Fill #0

## 2017-01-05 ENCOUNTER — Ambulatory Visit (INDEPENDENT_AMBULATORY_CARE_PROVIDER_SITE_OTHER): Payer: Medicaid Other | Admitting: Podiatry

## 2017-01-05 DIAGNOSIS — M216X9 Other acquired deformities of unspecified foot: Secondary | ICD-10-CM | POA: Diagnosis not present

## 2017-01-05 DIAGNOSIS — M7750 Other enthesopathy of unspecified foot: Secondary | ICD-10-CM

## 2017-01-06 ENCOUNTER — Encounter: Payer: Self-pay | Admitting: Podiatry

## 2017-01-06 NOTE — Patient Instructions (Signed)
Both feet x-ray done.

## 2017-01-06 NOTE — Progress Notes (Signed)
Patient came in to have x-rays done on both feet to prepare for phalangeal ostectomy. No other abnormal findings other than enlarged phalanx distal lateral that caused to have abnormal nails growth. Will proceed with planned surgery.

## 2017-01-15 ENCOUNTER — Other Ambulatory Visit: Payer: Self-pay | Admitting: Podiatry

## 2017-01-15 MED ORDER — OXYCODONE HCL 5 MG PO TABS: 5.0000 mg | ORAL_TABLET | ORAL | 0 refills | Status: DC | PRN

## 2017-01-16 DIAGNOSIS — M2570 Osteophyte, unspecified joint: Secondary | ICD-10-CM | POA: Diagnosis not present

## 2017-01-16 DIAGNOSIS — L600 Ingrowing nail: Secondary | ICD-10-CM | POA: Diagnosis not present

## 2017-01-16 HISTORY — PX: OTHER SURGICAL HISTORY: SHX169

## 2017-01-16 MED FILL — oxyCODONE HCL 5 MG TABS: 5 | 4 days supply | Qty: 40 | Fill #0

## 2017-01-22 ENCOUNTER — Ambulatory Visit (INDEPENDENT_AMBULATORY_CARE_PROVIDER_SITE_OTHER): Payer: Medicaid Other | Admitting: Podiatry

## 2017-01-22 ENCOUNTER — Encounter: Payer: Self-pay | Admitting: Podiatry

## 2017-01-22 DIAGNOSIS — Z9889 Other specified postprocedural states: Secondary | ICD-10-CM

## 2017-01-22 NOTE — Progress Notes (Signed)
1 week post op visit following bilateral partial bone resection with removal of partial nail 5th digit bilateral and excision of nail matrix left great toe medial border. Denies any problem. Only had some pain right after the surgery. All wounds are dry and covered with dry blood in gauze dressing. All cleansed with Iodine and DSS dressing applied. Return in one week.

## 2017-01-22 NOTE — Patient Instructions (Signed)
1 week post op wound healing normal. Dressing changed. Keep the bandage clean and dry. Return in one week.

## 2017-01-29 ENCOUNTER — Encounter: Payer: Self-pay | Admitting: Podiatry

## 2017-01-29 ENCOUNTER — Ambulatory Visit (INDEPENDENT_AMBULATORY_CARE_PROVIDER_SITE_OTHER): Payer: Medicaid Other | Admitting: Podiatry

## 2017-01-29 DIAGNOSIS — Z9889 Other specified postprocedural states: Secondary | ICD-10-CM

## 2017-01-29 NOTE — Progress Notes (Signed)
2 weeks post op visit following bilateral partial bone resection with removal of partial nail 5th digit bilateral and excision of nail matrix left great toe medial border (01/17/17).  All suture site area dry. Small area of blood blister at distal medial left 5th toe, opposite side of incision line.  Blood blister drained and cleansed with Iodine. Iodine dressing applied to left 5th toe wound. All sutures removed. All other wounds cleansed with Iodine and DSS dressing applied.  Ok to take outer bandage off this weekend and wash. Keep the tape on skin. Return in one week.

## 2017-01-29 NOTE — Patient Instructions (Signed)
2 weeks post op wound healing normal. All sutures removed. Keep the dressing till the weekend and then remove. May take shower and keep the tape in place by drying them after shower. Return in one week.

## 2017-02-02 MED FILL — TRANDOLAPRIL 2 MG TAB: 2 | 30 days supply | Qty: 30 | Fill #1

## 2017-02-02 MED FILL — BUPROPION HCL XL 300 MG TAB: 300 | 30 days supply | Qty: 30 | Fill #4

## 2017-02-02 MED FILL — GABAPENTIN 400 MG CAPSULE: 400 | 30 days supply | Qty: 90 | Fill #1

## 2017-02-02 MED FILL — risperiDONE 1 MG TABS: 1 | 30 days supply | Qty: 30 | Fill #3

## 2017-02-02 MED FILL — SERTRALINE HCL 100 MG TAB: 100 | 30 days supply | Qty: 45 | Fill #4

## 2017-02-05 ENCOUNTER — Encounter: Payer: Self-pay | Admitting: Podiatry

## 2017-02-05 ENCOUNTER — Ambulatory Visit (INDEPENDENT_AMBULATORY_CARE_PROVIDER_SITE_OTHER): Payer: Medicaid Other | Admitting: Podiatry

## 2017-02-05 DIAGNOSIS — Z9889 Other specified postprocedural states: Secondary | ICD-10-CM

## 2017-02-05 NOTE — Progress Notes (Signed)
Status post 3 weeks following excision bone spur 5th toe bilateral and excision nail matrix left great toe medial border. Denies any pain at this time. Wounds are dry and well healed. Normal post op wound healing doing well. Ok to keep the area open at night. May use band aid with regular shoe during the day. Return as needed.

## 2017-02-05 NOTE — Patient Instructions (Signed)
Normal post op wound healing doing well. Ok to keep the area open at night. May use band aid with regular shoe during the day. Return as needed.

## 2017-02-10 MED FILL — CROMOLYN 4% EYE DROPS: 4 | 25 days supply | Qty: 10 | Fill #0

## 2017-03-05 MED FILL — SERTRALINE HCL 100 MG TAB: 100 | 30 days supply | Qty: 45 | Fill #5

## 2017-03-05 MED FILL — buPROPion HCL ER (XL) 300 M: 300 | 30 days supply | Qty: 30 | Fill #5

## 2017-03-05 MED FILL — TRANDOLAPRIL 2 MG TAB: 2 | 30 days supply | Qty: 30 | Fill #2

## 2017-03-05 MED FILL — risperiDONE 1 MG TABS: 1 | 30 days supply | Qty: 30 | Fill #4

## 2017-03-05 MED FILL — GABAPENTIN 400 MG CAPSULE: 400 | 30 days supply | Qty: 90 | Fill #2

## 2017-03-10 ENCOUNTER — Ambulatory Visit (INDEPENDENT_AMBULATORY_CARE_PROVIDER_SITE_OTHER): Payer: Medicaid Other | Admitting: Podiatry

## 2017-03-10 ENCOUNTER — Encounter: Payer: Self-pay | Admitting: Podiatry

## 2017-03-10 DIAGNOSIS — Z9889 Other specified postprocedural states: Secondary | ICD-10-CM

## 2017-03-10 NOTE — Patient Instructions (Signed)
Noted of loose scab over the 5th toe left. Removed loose scab. Positive of mild erythema at medial border left great toe old ingrown nail surgery site. Continue keep it clean and dry. Will excise lesion if redness and pain continues. Return as needed.

## 2017-03-10 NOTE — Progress Notes (Signed)
Matthew Gross came in with his mother who has noted of redness over the left great toe nail base and dark loose scab over the 5th toe left.  Noted of loose scab over the 5th toe left. Removed loose scab. Positive of mild erythema at medial border left great toe old ingrown nail surgery site. Continue keep it clean and dry. Will excise lesion if redness and pain continues. Return as needed.

## 2017-04-03 MED FILL — SERTRALINE HCL 100 MG TAB: 100 | 30 days supply | Qty: 45 | Fill #0

## 2017-04-03 MED FILL — GABAPENTIN 400 MG CAPSULE: 400 | 30 days supply | Qty: 90 | Fill #3

## 2017-04-03 MED FILL — BUPROPION HCL XL 300 MG TAB: 300 | 30 days supply | Qty: 30 | Fill #0

## 2017-04-03 MED FILL — ATORVASTATIN 10 MG TABLET: 10 | 30 days supply | Qty: 30 | Fill #0

## 2017-04-03 MED FILL — risperiDONE 1 MG TABS: 1 | 30 days supply | Qty: 30 | Fill #5

## 2017-04-16 MED FILL — TRANDOLAPRIL 2 MG TAB: 2 | 30 days supply | Qty: 30 | Fill #0

## 2017-05-04 MED FILL — risperiDONE 1 MG TABS: 1 | 30 days supply | Qty: 30 | Fill #0

## 2017-05-04 MED FILL — ATORVASTATIN 10 MG TABLET: 10 | 30 days supply | Qty: 30 | Fill #1

## 2017-05-04 MED FILL — BUPROPION HCL XL 300 MG TAB: 300 | 30 days supply | Qty: 30 | Fill #1

## 2017-05-04 MED FILL — SERTRALINE HCL 100 MG TAB: 100 | 30 days supply | Qty: 45 | Fill #1

## 2017-05-04 MED FILL — GABAPENTIN 400 MG CAPSULE: 400 | 30 days supply | Qty: 90 | Fill #4

## 2017-06-04 MED FILL — risperiDONE 1 MG TABS: 1 | 30 days supply | Qty: 30 | Fill #1

## 2017-06-04 MED FILL — ATORVASTATIN 10 MG TABLET: 10 | 30 days supply | Qty: 30 | Fill #2

## 2017-06-04 MED FILL — GABAPENTIN 400 MG CAPSULE: 400 | 30 days supply | Qty: 90 | Fill #5

## 2017-06-04 MED FILL — TRANDOLAPRIL 2 MG TAB: 2 | 30 days supply | Qty: 30 | Fill #1

## 2017-06-04 MED FILL — SERTRALINE HCL 100 MG TAB: 100 | 30 days supply | Qty: 45 | Fill #2

## 2017-06-04 MED FILL — BUPROPION HCL XL 300 MG TAB: 300 | 30 days supply | Qty: 30 | Fill #2

## 2017-07-02 MED FILL — BUPROPION HCL XL 300 MG TAB: 300 | 30 days supply | Qty: 30 | Fill #3

## 2017-07-02 MED FILL — TRANDOLAPRIL 2 MG TAB: 2 | 30 days supply | Qty: 30 | Fill #2

## 2017-07-02 MED FILL — SERTRALINE HCL 100 MG TAB: 100 | 30 days supply | Qty: 45 | Fill #3

## 2017-07-02 MED FILL — ATORVASTATIN 10 MG TABLET: 10 | 30 days supply | Qty: 30 | Fill #3

## 2017-07-02 MED FILL — GABAPENTIN 400 MG CAPSULE: 400 | 30 days supply | Qty: 90 | Fill #0

## 2017-07-02 MED FILL — risperiDONE 1 MG TABS: 1 | 30 days supply | Qty: 30 | Fill #2

## 2017-07-30 MED FILL — GABAPENTIN 400 MG CAPSULE: 400 | 30 days supply | Qty: 90 | Fill #1

## 2017-07-30 MED FILL — SERTRALINE HCL 100 MG TAB: 100 | 30 days supply | Qty: 45 | Fill #4

## 2017-07-30 MED FILL — ATORVASTATIN 10 MG TABLET: 10 | 30 days supply | Qty: 30 | Fill #4

## 2017-07-30 MED FILL — risperiDONE 1 MG TABS: 1 | 30 days supply | Qty: 30 | Fill #3

## 2017-07-30 MED FILL — BUPROPION HCL XL 300 MG TAB: 300 | 30 days supply | Qty: 30 | Fill #4

## 2017-07-30 MED FILL — TRANDOLAPRIL 2 MG TAB: 2 | 30 days supply | Qty: 30 | Fill #3

## 2017-08-31 MED FILL — ATORVASTATIN 10 MG TABLET: 10 | 30 days supply | Qty: 30 | Fill #5

## 2017-08-31 MED FILL — risperiDONE 1 MG TABS: 1 | 30 days supply | Qty: 30 | Fill #4

## 2017-08-31 MED FILL — TRANDOLAPRIL 2 MG TAB: 2 | 30 days supply | Qty: 30 | Fill #4

## 2017-08-31 MED FILL — SERTRALINE HCL 100 MG TAB: 100 | 30 days supply | Qty: 45 | Fill #5

## 2017-08-31 MED FILL — GABAPENTIN 400 MG CAPSULE: 400 | 30 days supply | Qty: 90 | Fill #2

## 2017-08-31 MED FILL — BUPROPION HCL XL 300 MG TAB: 300 | 30 days supply | Qty: 30 | Fill #5

## 2017-09-29 MED FILL — SERTRALINE HCL 100 MG TAB: 100 | 30 days supply | Qty: 45 | Fill #0

## 2017-09-29 MED FILL — TRANDOLAPRIL 2 MG TAB: 2 | 30 days supply | Qty: 30 | Fill #5

## 2017-09-29 MED FILL — GABAPENTIN 400 MG CAPSULE: 400 | 30 days supply | Qty: 90 | Fill #3

## 2017-09-29 MED FILL — BUPROPION HCL XL 300 MG TAB: 300 | 30 days supply | Qty: 30 | Fill #0

## 2017-09-29 MED FILL — risperiDONE 1 MG TABS: 1 | 30 days supply | Qty: 30 | Fill #5

## 2017-09-30 MED FILL — ATORVASTATIN 10 MG TABLET: 10 | 30 days supply | Qty: 30 | Fill #0

## 2017-10-28 MED FILL — TRANDOLAPRIL 2 MG TAB: 2 | 90 days supply | Qty: 90 | Fill #0

## 2017-10-28 MED FILL — SERTRALINE HCL 100 MG TAB: 100 | 30 days supply | Qty: 45 | Fill #1

## 2017-10-28 MED FILL — BUPROPION HCL XL 300 MG TAB: 300 | 30 days supply | Qty: 30 | Fill #1

## 2017-10-28 MED FILL — GABAPENTIN 400 MG CAPSULE: 400 | 30 days supply | Qty: 90 | Fill #4

## 2017-10-28 MED FILL — ATORVASTATIN 10 MG TABLET: 10 | 30 days supply | Qty: 30 | Fill #1

## 2017-10-28 MED FILL — risperiDONE 1 MG TABS: 1 | 30 days supply | Qty: 30 | Fill #0

## 2017-11-27 MED FILL — risperiDONE 1 MG TABS: 1 | 30 days supply | Qty: 30 | Fill #1

## 2017-11-27 MED FILL — BUPROPION HCL XL 300 MG TAB: 300 | 30 days supply | Qty: 30 | Fill #2

## 2017-11-27 MED FILL — ATORVASTATIN 10 MG TABLET: 10 | 30 days supply | Qty: 30 | Fill #2

## 2017-11-27 MED FILL — GABAPENTIN 400 MG CAPSULE: 400 | 30 days supply | Qty: 90 | Fill #5

## 2017-11-27 MED FILL — SERTRALINE HCL 100 MG TAB: 100 | 30 days supply | Qty: 45 | Fill #2

## 2017-12-21 ENCOUNTER — Encounter: Payer: Self-pay | Admitting: Podiatry

## 2017-12-21 ENCOUNTER — Ambulatory Visit: Payer: Medicaid Other | Admitting: Podiatry

## 2017-12-21 DIAGNOSIS — L603 Nail dystrophy: Secondary | ICD-10-CM | POA: Diagnosis not present

## 2017-12-23 NOTE — Progress Notes (Signed)
   Subjective: 37 year old male presenting today as a new patient with a chief complaint of nail problems to the left great toenail as well as bilateral fifth toes that has been ongoing for the past year. He states he had the three nails removed last year and the left great toenail is not growing back properly. He states his bilateral fifth toenails are thickened. He has not done anything for treatment at home. He denies modifying factors. Patient is here for further evaluation and treatment.   Past Medical History:  Diagnosis Date  . Autistic disorder PT GOES TO A DAY PROGRAM DAILY AND HAS ASSISTANCE AT HOME IN THE EVERY   PT MOTHER LEGAL [place]  . Colon polyps   . Depressed bipolar II disorder ([place])   . Hyperlipidemia   . Hypertension   . Penile cyst   . Rectal bleeding 2017   before and colonscopy    Objective: Physical Exam General: The patient is alert and oriented x3 in no acute distress.  Dermatology: Hyperkeratotic, discolored, thickened, onychodystrophy of bilateral fifth toenails. Skin is warm, dry and supple bilateral lower extremities. Negative for open lesions or macerations.  Vascular: Palpable pedal pulses bilaterally. No edema or erythema noted. Capillary refill within normal limits.  Neurological: Epicritic and protective threshold grossly intact bilaterally.   Musculoskeletal Exam: Range of motion within normal limits to all pedal and ankle joints bilateral. Muscle strength 5/5 in all groups bilateral.   Assessment: #1 dystrophic nails bilateral fifth toes   Plan of Care:  #1 Patient was evaluated. #2 Mechanical debridement of fifth toenails bilaterally performed using a nail nipper. Filed with dremel without incident.  #3 Recommended good shoe gear.  #4 Return to clinic as needed.    Edrick Kins, DPM [facility]  Dr. Edrick Kins, [address] [place]                                        [place], [place] [address]                 Office [phone]  Fax [phone]

## 2017-12-29 MED FILL — BUPROPION HCL XL 300 MG TAB: 300 | 30 days supply | Qty: 30 | Fill #3

## 2017-12-29 MED FILL — risperiDONE 1 MG TABS: 1 | 30 days supply | Qty: 30 | Fill #2

## 2017-12-29 MED FILL — ATORVASTATIN 10 MG TABLET: 10 | 30 days supply | Qty: 30 | Fill #3

## 2017-12-29 MED FILL — GABAPENTIN 400 MG CAPSULE: 400 | 30 days supply | Qty: 90 | Fill #0

## 2017-12-29 MED FILL — SERTRALINE HCL 100 MG TAB: 100 | 30 days supply | Qty: 45 | Fill #3

## 2018-01-29 MED FILL — ATORVASTATIN 10 MG TABLET: 10 | 30 days supply | Qty: 30 | Fill #4

## 2018-01-29 MED FILL — TRANDOLAPRIL 2 MG TAB: 2 | 90 days supply | Qty: 90 | Fill #1

## 2018-01-29 MED FILL — BUPROPION HCL XL 300 MG TAB: 300 | 30 days supply | Qty: 30 | Fill #4

## 2018-01-29 MED FILL — risperiDONE 1 MG TABS: 1 | 30 days supply | Qty: 30 | Fill #3

## 2018-01-29 MED FILL — SERTRALINE HCL 100 MG TAB: 100 | 30 days supply | Qty: 45 | Fill #4

## 2018-01-29 MED FILL — GABAPENTIN 400 MG CAPSULE: 400 | 30 days supply | Qty: 90 | Fill #1

## 2018-03-01 MED FILL — ATORVASTATIN 10 MG TABLET: 10 | 30 days supply | Qty: 30 | Fill #5

## 2018-03-03 MED FILL — risperiDONE 1 MG TABS: 1 | 30 days supply | Qty: 30 | Fill #4

## 2018-03-03 MED FILL — SERTRALINE HCL 100 MG TAB: 100 | 30 days supply | Qty: 45 | Fill #5

## 2018-03-03 MED FILL — GABAPENTIN 400 MG CAPSULE: 400 | 30 days supply | Qty: 90 | Fill #2

## 2018-03-03 MED FILL — BUPROPION HCL XL 300 MG TAB: 300 | 30 days supply | Qty: 30 | Fill #5

## 2018-04-01 MED FILL — BuPROPion HCL ER (XL) 300 M: 300 | 30 days supply | Qty: 30 | Fill #0

## 2018-04-01 MED FILL — risperiDONE 1 MG TABS: 1 | 30 days supply | Qty: 30 | Fill #5

## 2018-04-01 MED FILL — GABAPENTIN 400 MG CAPSULE: 400 | 30 days supply | Qty: 90 | Fill #3

## 2018-04-01 MED FILL — SERTRALINE HCL 100 MG TAB: 100 | 30 days supply | Qty: 45 | Fill #0

## 2018-04-01 MED FILL — ATORVASTATIN 10 MG TABLET: 10 | 30 days supply | Qty: 30 | Fill #6

## 2018-04-19 DIAGNOSIS — I10 Essential (primary) hypertension: Secondary | ICD-10-CM | POA: Diagnosis not present

## 2018-04-19 DIAGNOSIS — Z23 Encounter for immunization: Secondary | ICD-10-CM | POA: Diagnosis not present

## 2018-04-19 DIAGNOSIS — E785 Hyperlipidemia, unspecified: Secondary | ICD-10-CM | POA: Diagnosis not present

## 2018-04-29 MED FILL — GABAPENTIN 400 MG CAPSULE: 400 | 30 days supply | Qty: 90 | Fill #0

## 2018-04-29 MED FILL — SERTRALINE HCL 100 MG TAB: 100 | 30 days supply | Qty: 45 | Fill #1

## 2018-04-29 MED FILL — ATORVASTATIN 10 MG TABLET: 10 | 30 days supply | Qty: 30 | Fill #7

## 2018-04-29 MED FILL — BuPROPion HCL ER (XL) 300 M: 300 | 30 days supply | Qty: 30 | Fill #1

## 2018-04-29 MED FILL — risperiDONE 1 MG TABS: 1 | 30 days supply | Qty: 30 | Fill #0

## 2018-04-29 MED FILL — TRANDOLAPRIL 2 MG TAB: 2 | 90 days supply | Qty: 90 | Fill #2

## 2018-05-14 ENCOUNTER — Telehealth: Payer: Self-pay

## 2018-05-14 NOTE — Telephone Encounter (Signed)
Shanon Ace called stating she needs s copy of the patient's labs for his mental health. Copies have been printed off and placed in an envelope up front in Janece's bin. YRL,RMA

## 2018-05-27 MED FILL — ATORVASTATIN 10 MG TABLET: 10 | 30 days supply | Qty: 30 | Fill #8

## 2018-05-27 MED FILL — BuPROPion HCL ER (XL) 300 M: 300 | 30 days supply | Qty: 30 | Fill #2

## 2018-05-27 MED FILL — SERTRALINE HCL 100 MG TAB: 100 | 30 days supply | Qty: 45 | Fill #2

## 2018-05-27 MED FILL — risperiDONE 1 MG TABS: 1 | 30 days supply | Qty: 30 | Fill #1

## 2018-05-27 MED FILL — GABAPENTIN 400 MG CAPSULE: 400 | 30 days supply | Qty: 90 | Fill #1

## 2018-06-28 ENCOUNTER — Other Ambulatory Visit: Payer: Self-pay

## 2018-06-28 MED ORDER — ATORVASTATIN CALCIUM 10 MG PO TABS: 10.0000 mg | ORAL_TABLET | Freq: Every day | ORAL | 1 refills | Status: DC

## 2018-06-28 MED FILL — SERTRALINE HCL 100 MG TAB: 100 | 30 days supply | Qty: 45 | Fill #3

## 2018-06-28 MED FILL — buPROPion HCL ER (XL) 300 M: 300 | 30 days supply | Qty: 30 | Fill #3

## 2018-06-28 MED FILL — GABAPENTIN 400 MG CAPSULE: 400 | 30 days supply | Qty: 90 | Fill #2

## 2018-06-28 MED FILL — ATORVASTATIN 10 MG TABLET: 10 | 90 days supply | Qty: 90 | Fill #0

## 2018-06-28 MED FILL — risperiDONE 1 MG TABS: 1 | 30 days supply | Qty: 30 | Fill #2

## 2018-07-29 MED FILL — GABAPENTIN 400 MG CAPSULE: 400 | 30 days supply | Qty: 90 | Fill #3

## 2018-07-29 MED FILL — risperiDONE 1 MG TABS: 1 | 30 days supply | Qty: 30 | Fill #3

## 2018-07-29 MED FILL — TRANDOLAPRIL 2 MG TAB: 2 | 90 days supply | Qty: 90 | Fill #0

## 2018-07-29 MED FILL — SERTRALINE HCL 100 MG TAB: 100 | 30 days supply | Qty: 45 | Fill #0

## 2018-07-29 MED FILL — buPROPion HCL ER (XL) 300 M: 300 | 30 days supply | Qty: 30 | Fill #0

## 2018-09-24 MED FILL — SERTRALINE HCL 100 MG TAB: 100 | 30 days supply | Qty: 45 | Fill #1 | Status: TO

## 2018-09-27 MED FILL — GABAPENTIN 300 MG CAPSULE: 300 | 30 days supply | Qty: 90 | Fill #0 | Status: TO

## 2018-09-27 MED FILL — ATORVASTATIN 10 MG TABLET: 10 | 90 days supply | Qty: 90 | Fill #1

## 2018-09-27 MED FILL — buPROPion HCL ER (XL) 300 M: 300 | 30 days supply | Qty: 30 | Fill #0 | Status: TO

## 2018-09-27 MED FILL — risperiDONE 1 MG TABS: 1 | 30 days supply | Qty: 30 | Fill #0 | Status: TO

## 2018-10-18 ENCOUNTER — Encounter: Payer: Self-pay | Admitting: Nurse Practitioner

## 2018-10-18 ENCOUNTER — Other Ambulatory Visit: Payer: Self-pay

## 2018-10-18 ENCOUNTER — Ambulatory Visit (INDEPENDENT_AMBULATORY_CARE_PROVIDER_SITE_OTHER): Payer: Medicaid Other | Admitting: Nurse Practitioner

## 2018-10-18 VITALS — BP 130/84 | HR 77 | Temp 98.5°F | Ht 69.0 in | Wt 185.0 lb

## 2018-10-18 DIAGNOSIS — F3181 Bipolar II disorder: Secondary | ICD-10-CM

## 2018-10-18 DIAGNOSIS — F840 Autistic disorder: Secondary | ICD-10-CM | POA: Diagnosis not present

## 2018-10-18 DIAGNOSIS — E782 Mixed hyperlipidemia: Secondary | ICD-10-CM

## 2018-10-18 DIAGNOSIS — Z0000 Encounter for general adult medical examination without abnormal findings: Secondary | ICD-10-CM

## 2018-10-18 DIAGNOSIS — I10 Essential (primary) hypertension: Secondary | ICD-10-CM | POA: Diagnosis not present

## 2018-10-18 MED ORDER — ATORVASTATIN CALCIUM 10 MG PO TABS: 10.0000 mg | ORAL_TABLET | Freq: Every day | ORAL | 1 refills | Status: DC

## 2018-10-18 MED ORDER — TRANDOLAPRIL 2 MG PO TABS: 2.0000 mg | ORAL_TABLET | Freq: Every day | ORAL | 1 refills | Status: DC

## 2018-10-18 NOTE — Patient Instructions (Signed)

## 2018-10-18 NOTE — Progress Notes (Signed)
Subjective:     Patient ID: Matthew Gross , male    DOB: [birth year] , 38 y.o.   MRN: [id number]   Chief Complaint  Patient presents with  . Annual Exam    HPI Men's preventive visit. Patient Health Questionnaire (PHQ-2) is    Office Visit from 10/18/2018 in [facility]  PHQ-2 Total Score  0     Patient is on a regular diet. Marital status: Single. Relevant history for alcohol use is:   Social History   Substance and Sexual Activity  Alcohol Use No   Relevant history for tobacco use is:  Social History   Tobacco Use  Smoking Status Never Smoker  Smokeless Tobacco Never Used   Hyperlipidemia - doing well with his medications.     Past Medical History:  Diagnosis Date  . Autistic disorder PT GOES TO A DAY PROGRAM DAILY AND HAS ASSISTANCE AT HOME IN THE EVERY   PT MOTHER LEGAL [place]  . Colon polyps   . Depressed bipolar II disorder ([place])   . Hyperlipidemia   . Hypertension   . Penile cyst   . Rectal bleeding 2017   before and colonscopy     Family History  Problem Relation Age of Onset  . Heart failure Mother   . Hypertension Mother   . Hyperlipidemia Mother      Current Outpatient Medications:  .  atorvastatin (LIPITOR) 10 MG tablet, Take 1 tablet (10 mg total) by mouth daily., Disp: 90 tablet, Rfl: 1 .  buPROPion (WELLBUTRIN XL) 300 MG 24 hr tablet, Take 300 mg by mouth daily. , Disp: , Rfl:  .  fexofenadine-pseudoephedrine (ALLEGRA-D) 60-120 MG per tablet, Take 1 tablet by mouth every 12 (twelve) hours., Disp: 30 tablet, Rfl: 0 .  gabapentin (NEURONTIN) 400 MG capsule, Take 800 mg by mouth at bedtime. , Disp: , Rfl: 0 .  risperiDONE (RISPERDAL) 1 MG tablet, Take 1 mg by mouth every other day. , Disp: , Rfl: 0 .  sertraline (ZOLOFT) 100 MG tablet, Take 150 mg by mouth daily., Disp: , Rfl: 0 .  trandolapril (MAVIK) 2 MG tablet, Take 1 tablet (2 mg total) by mouth every morning. (Patient taking differently: Take 2 mg by mouth daily. ),  Disp: 30 tablet, Rfl: 3   Allergies  Allergen Reactions  . Penicillins Hives    Has patient had a PCN reaction causing immediate rash, facial/tongue/throat swelling, SOB or lightheadedness with hypotension: Yes Has patient had a PCN reaction causing severe rash involving mucus membranes or skin necrosis: No Has patient had a PCN reaction that required hospitalization No Has patient had a PCN reaction occurring within the last 10 years: Yes If all of the above answers are "NO", then may proceed with Cephalosporin use.  . Robaxin [Methocarbamol] Rash     Review of Systems  Constitutional: Negative.   HENT: Negative.   Eyes: Negative.   Respiratory: Negative.   Cardiovascular: Negative.   Gastrointestinal: Negative.   Endocrine: Negative.   Genitourinary: Negative.   Musculoskeletal: Negative.   Skin: Negative.   Allergic/Immunologic: Negative.   Neurological: Negative.   Hematological: Negative.   Psychiatric/Behavioral: Negative.      Today's Vitals   10/18/18 0911  BP: 130/84  Pulse: 77  Temp: 98.5 F (36.9 C)  TempSrc: Oral  SpO2: (!) 6%  Weight: 185 lb (83.9 kg)  Height: 5\' 9"  (1.753 m)   Body mass index is 27.32 kg/m.   Objective:  Physical Exam Vitals  signs reviewed.  Constitutional:      Appearance: Normal appearance.  HENT:     Head: Normocephalic and atraumatic.     Right Ear: Tympanic membrane, ear canal and external ear normal.     Left Ear: Tympanic membrane, ear canal and external ear normal.     Nose: Nose normal. No congestion.     Mouth/Throat:     Mouth: Mucous membranes are moist.  Eyes:     General:        Right eye: No discharge.        Left eye: No discharge.     Extraocular Movements: Extraocular movements intact.     Conjunctiva/sclera: Conjunctivae normal.     Pupils: Pupils are equal, round, and reactive to light.  Neck:     Musculoskeletal: Normal range of motion and neck supple.  Cardiovascular:     Rate and Rhythm: Normal  rate and regular rhythm.     Pulses: Normal pulses.     Heart sounds: Normal heart sounds. No murmur.  Pulmonary:     Effort: Pulmonary effort is normal. No respiratory distress.     Breath sounds: Normal breath sounds.  Abdominal:     General: Abdomen is flat. Bowel sounds are normal. There is no distension.     Palpations: Abdomen is soft.  Musculoskeletal: Normal range of motion.        General: No swelling or tenderness.  Skin:    General: Skin is warm and dry.     Capillary Refill: Capillary refill takes less than 2 seconds.  Neurological:     General: No focal deficit present.     Mental Status: He is alert and oriented to person, place, and time. Mental status is at baseline.  Psychiatric:        Mood and Affect: Mood normal.        Behavior: Behavior normal.        Thought Content: Thought content normal.        Judgment: Judgment normal.         Assessment And Plan:     1. Health maintenance examination . Behavior modifications discussed and diet history reviewed.   . Pt will continue to exercise regularly and modify diet with low GI, plant based foods and decrease intake of processed foods.  . Recommend intake of daily multivitamin, Vitamin D, and calcium.  . Recommend mammogram and colonoscopy for preventive screenings, as well as recommend immunizations that include influenza, TDAP, and Shingles   2. Essential hypertension . B/P is controlled.  . CMP ordered to check renal function.  . The importance of regular exercise and dietary modification was stressed to the patient.  . Stressed importance of losing ten percent of her body weight to help with B/P control.  . The weight loss would help with decreasing cardiac and cancer risk as well.  - trandolapril (MAVIK) 2 MG tablet; Take 1 tablet (2 mg total) by mouth daily.  Dispense: 90 tablet; Refill: 1  3. Bipolar II disorder ([place])  .Continue with follow up with behavioral health  4. Mixed  hyperlipidemia  Chronic, controlled  Continue with current medications - atorvastatin (LIPITOR) 10 MG tablet; Take 1 tablet (10 mg total) by mouth daily.  Dispense: 90 tablet; Refill: 1  5. Autistic disorder  Stable and is followed at behavioral health, no issues  Attends day program regularly        [name], FNP

## 2018-10-19 LAB — CMP14 + ANION GAP
A/G RATIO: 2 (ref 1.2–2.2)
ALK PHOS: 55 IU/L (ref 39–117)
ALT: 22 IU/L (ref 0–44)
AST: 19 IU/L (ref 0–40)
Albumin: 4.7 g/dL (ref 4.0–5.0)
Anion Gap: 16 mmol/L (ref 10.0–18.0)
BILIRUBIN TOTAL: 0.5 mg/dL (ref 0.0–1.2)
BUN/Creatinine Ratio: 8 — ABNORMAL LOW (ref 9–20)
BUN: 9 mg/dL (ref 6–20)
CHLORIDE: 102 mmol/L (ref 96–106)
CO2: 23 mmol/L (ref 20–29)
Calcium: 9.8 mg/dL (ref 8.7–10.2)
Creatinine, Ser: 1.1 mg/dL (ref 0.76–1.27)
GFR calc non Af Amer: 86 mL/min/{1.73_m2} (ref >59)
GFR, EST AFRICAN AMERICAN: 99 mL/min/{1.73_m2} (ref >59)
GLUCOSE: 59 mg/dL — AB (ref 65–99)
Globulin, Total: 2.3 g/dL (ref 1.5–4.5)
POTASSIUM: 5 mmol/L (ref 3.5–5.2)
Sodium: 141 mmol/L (ref 134–144)
TOTAL PROTEIN: 7 g/dL (ref 6.0–8.5)

## 2018-10-19 LAB — CBC
HEMATOCRIT: 41.2 % (ref 37.5–51.0)
Hemoglobin: 14.4 g/dL (ref 13.0–17.7)
MCH: 29.3 pg (ref 26.6–33.0)
MCHC: 35 g/dL (ref 31.5–35.7)
MCV: 84 fL (ref 79–97)
Platelets: 193 10*3/uL (ref 150–450)
RBC: 4.92 x10E6/uL (ref 4.14–5.80)
RDW: 13 % (ref 11.6–15.4)
WBC: 3.6 10*3/uL (ref 3.4–10.8)

## 2018-10-19 LAB — LIPID PANEL
CHOL/HDL RATIO: 2.3 ratio (ref 0.0–5.0)
Cholesterol, Total: 136 mg/dL (ref 100–199)
HDL: 60 mg/dL (ref >39)
LDL CALC: 61 mg/dL (ref 0–99)
TRIGLYCERIDES: 74 mg/dL (ref 0–149)
VLDL CHOLESTEROL CAL: 15 mg/dL (ref 5–40)

## 2018-10-19 LAB — VITAMIN D 25 HYDROXY (VIT D DEFICIENCY, FRACTURES): Vit D, 25-Hydroxy: 20.8 ng/mL — ABNORMAL LOW (ref 30.0–100.0)

## 2018-10-21 ENCOUNTER — Other Ambulatory Visit: Payer: Self-pay

## 2018-10-21 MED ORDER — VITAMIN D3 1.25 MG (50000 UT) PO CAPS: ORAL_CAPSULE | ORAL | 1 refills | Status: DC

## 2018-10-27 NOTE — Progress Notes (Signed)
This has been sent in  

## 2018-10-28 ENCOUNTER — Other Ambulatory Visit: Payer: Self-pay

## 2018-12-21 ENCOUNTER — Other Ambulatory Visit: Payer: Self-pay

## 2018-12-21 DIAGNOSIS — E782 Mixed hyperlipidemia: Secondary | ICD-10-CM

## 2018-12-21 DIAGNOSIS — I10 Essential (primary) hypertension: Secondary | ICD-10-CM

## 2018-12-21 MED ORDER — TRANDOLAPRIL 2 MG PO TABS: 2.0000 mg | ORAL_TABLET | Freq: Every day | ORAL | 1 refills | Status: DC

## 2018-12-21 MED ORDER — ATORVASTATIN CALCIUM 10 MG PO TABS: 10.0000 mg | ORAL_TABLET | Freq: Every day | ORAL | 1 refills | Status: DC

## 2018-12-21 NOTE — Telephone Encounter (Signed)
Patient's mother called requesting refills on Atorvastatin and Trandolapril. She has been notified that they both have been sent to the pharmacy. YRL,RMA

## 2019-02-28 ENCOUNTER — Encounter: Payer: Self-pay | Admitting: Nurse Practitioner

## 2019-02-28 ENCOUNTER — Other Ambulatory Visit: Payer: Self-pay

## 2019-02-28 ENCOUNTER — Ambulatory Visit (INDEPENDENT_AMBULATORY_CARE_PROVIDER_SITE_OTHER): Payer: Medicaid Other | Admitting: Nurse Practitioner

## 2019-02-28 VITALS — BP 120/78 | HR 79 | Temp 98.0°F | Ht 68.6 in | Wt 189.0 lb

## 2019-02-28 DIAGNOSIS — F3181 Bipolar II disorder: Secondary | ICD-10-CM | POA: Diagnosis not present

## 2019-02-28 DIAGNOSIS — E559 Vitamin D deficiency, unspecified: Secondary | ICD-10-CM | POA: Diagnosis not present

## 2019-02-28 DIAGNOSIS — E782 Mixed hyperlipidemia: Secondary | ICD-10-CM | POA: Diagnosis not present

## 2019-02-28 DIAGNOSIS — I10 Essential (primary) hypertension: Secondary | ICD-10-CM

## 2019-02-28 MED ORDER — VITAMIN D (ERGOCALCIFEROL) 1.25 MG (50000 UNIT) PO CAPS: 50000.0000 [IU] | ORAL_CAPSULE | ORAL | 1 refills | Status: DC

## 2019-02-28 NOTE — Progress Notes (Signed)
Subjective:     Patient ID: Matthew Gross , male    DOB: Nov 28, 1980 , 38 y.o.   MRN: [id number]   Chief Complaint  Patient presents with  . Hypertension    HPI  Hypertension This is a chronic problem. The current episode started more than 1 year ago. The problem is unchanged. The problem is controlled. Pertinent negatives include no anxiety or headaches. There are no associated agents to hypertension. Risk factors for coronary artery disease include sedentary lifestyle and male gender. Past treatments include ACE inhibitors. The current treatment provides no improvement. There are no compliance problems.  There is no history of angina.     Past Medical History:  Diagnosis Date  . Autistic disorder PT GOES TO A DAY PROGRAM DAILY AND HAS ASSISTANCE AT HOME IN THE EVERY   PT MOTHER LEGAL [place]  . Colon polyps   . 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 Hyperlipidemia   . Hypertension   . Penile cyst   . Rectal bleeding 2017   before and colonscopy     Family History  Problem Relation Age of Onset  . Heart failure Mother   . Hypertension Mother   . Hyperlipidemia Mother      Current Outpatient Medications:  .  atorvastatin (LIPITOR) 10 MG tablet, Take 1 tablet (10 mg total) by mouth daily., Disp: 90 tablet, Rfl: 1 .  buPROPion (WELLBUTRIN XL) 300 MG 24 hr tablet, Take 300 mg by mouth daily. , Disp: , Rfl:  .  gabapentin (NEURONTIN) 400 MG capsule, Take 800 mg by mouth at bedtime. , Disp: , Rfl: 0 .  Loratadine (CLARITIN PO), Take 1 capsule by mouth daily., Disp: , Rfl:  .  risperiDONE (RISPERDAL) 1 MG tablet, Take 1 mg by mouth every other day. , Disp: , Rfl: 0 .  sertraline (ZOLOFT) 100 MG tablet, Take 150 mg by mouth daily., Disp: , Rfl: 0 .  trandolapril (MAVIK) 2 MG tablet, Take 1 tablet (2 mg total) by mouth daily., Disp: 90 tablet, Rfl: 1   Allergies  Allergen Reactions  . Penicillins Hives    Has patient had a PCN reaction causing immediate rash,  facial/tongue/throat swelling, SOB or lightheadedness with hypotension: Yes Has patient had a PCN reaction causing severe rash involving mucus membranes or skin necrosis: No Has patient had a PCN reaction that required hospitalization No Has patient had a PCN reaction occurring within the last 10 years: Yes If all of the above answers are "NO", then may proceed with Cephalosporin use.  . Robaxin [Methocarbamol] Rash     Review of Systems  Constitutional: Negative.   Respiratory: Negative.   Cardiovascular: Negative.   Musculoskeletal: Negative.   Neurological: Negative for dizziness and headaches.     Today's Vitals   02/28/19 0952  BP: 120/78  Pulse: 79  Temp: 98 F (36.7 C)  TempSrc: Oral  Weight: 189 lb (85.7 kg)  Height: 5' 8.6" (1.742 m)  PainSc: 0-No pain   Body mass index is 28.24 kg/m.   Objective:  Physical Exam Cardiovascular:     Rate and Rhythm: Normal rate and regular rhythm.     Pulses: Normal pulses.     Heart sounds: Normal heart sounds.  Pulmonary:     Effort: Pulmonary effort is normal.     Breath sounds: Normal breath sounds.  Skin:    General: Skin is warm and dry.     Capillary Refill: Capillary refill takes less than 2 seconds.  Neurological:     General: No focal deficit present.     Mental Status: He is alert and oriented to person, place, and time.  Psychiatric:        Mood and Affect: Mood normal.        Behavior: Behavior normal.        Thought Content: Thought content normal.        Judgment: Judgment normal.         Assessment And Plan:     1. Essential hypertension . B/P is well controlled.   . The importance of regular exercise and dietary modification was stressed to the patient.   2. Mixed hyperlipidemia Chronic, controlled Continue with current medications No current issues with medications  3. Vitamin D deficiency  Will refill vitamin d 50,000 units  Will recheck levels at next visit - Vitamin D, Ergocalciferol,  (DRISDOL) 1.25 MG (50000 UT) CAPS capsule; Take 1 capsule (50,000 Units total) by mouth every 7 (seven) days.  Dispense: 12 capsule; Refill: 1  4. Bipolar II disorder ([place])  Chronic, being followed by behavioral health   [name], FNP    THE PATIENT IS ENCOURAGED TO PRACTICE SOCIAL DISTANCING DUE TO THE COVID-19 PANDEMIC.

## 2019-04-25 ENCOUNTER — Telehealth: Payer: Self-pay | Admitting: Nurse Practitioner

## 2019-04-25 NOTE — Telephone Encounter (Signed)
PT MOTHER CALLED IN REQ THAT PA BE DONE ON RX FOR MAVIK 2MG  SHE WENT TO GET REFILL PHARMACY TOLD HER RX NEEDED PA

## 2019-04-25 NOTE — Telephone Encounter (Signed)
Call mother to make aware will be done tomorrow

## 2019-04-26 ENCOUNTER — Telehealth: Payer: Self-pay | Admitting: Nurse Practitioner

## 2019-04-26 ENCOUNTER — Telehealth: Payer: Self-pay

## 2019-04-26 NOTE — Telephone Encounter (Signed)
Patient notified YRL,RMA 

## 2019-04-26 NOTE — Telephone Encounter (Signed)
Spoke with Jonelle Sidle at [facility] for [place], reference number [id number] for Trandolopril approval.  Then spoke with Isha for PA. He has tried Lisinopril and unable to verbalize adequate if having any side effects (Autism) Has also been on Trandoloril for 5 years without any problems.  Trandolopril was approved for 365 days.  PA # [id number]

## 2019-04-26 NOTE — Telephone Encounter (Signed)
Patient and pharmacy have both been notified that PA for Trandolapril has been approved. YRL,RMA

## 2019-05-19 ENCOUNTER — Other Ambulatory Visit: Payer: Self-pay | Admitting: Nurse Practitioner

## 2019-05-19 DIAGNOSIS — E559 Vitamin D deficiency, unspecified: Secondary | ICD-10-CM

## 2019-05-31 ENCOUNTER — Other Ambulatory Visit: Payer: Self-pay

## 2019-05-31 ENCOUNTER — Encounter: Payer: Self-pay | Admitting: Nurse Practitioner

## 2019-05-31 ENCOUNTER — Ambulatory Visit (INDEPENDENT_AMBULATORY_CARE_PROVIDER_SITE_OTHER): Payer: Medicaid Other | Admitting: Nurse Practitioner

## 2019-05-31 VITALS — BP 122/68 | HR 77 | Temp 98.4°F | Ht 68.6 in | Wt 185.6 lb

## 2019-05-31 DIAGNOSIS — E782 Mixed hyperlipidemia: Secondary | ICD-10-CM | POA: Diagnosis not present

## 2019-05-31 DIAGNOSIS — E559 Vitamin D deficiency, unspecified: Secondary | ICD-10-CM | POA: Diagnosis not present

## 2019-05-31 DIAGNOSIS — I10 Essential (primary) hypertension: Secondary | ICD-10-CM

## 2019-05-31 DIAGNOSIS — Z23 Encounter for immunization: Secondary | ICD-10-CM | POA: Diagnosis not present

## 2019-05-31 MED ORDER — TRANDOLAPRIL 2 MG PO TABS: 2.0000 mg | ORAL_TABLET | Freq: Every day | ORAL | 1 refills | Status: DC

## 2019-05-31 MED ORDER — ATORVASTATIN CALCIUM 10 MG PO TABS: 10.0000 mg | ORAL_TABLET | Freq: Every day | ORAL | 1 refills | Status: DC

## 2019-05-31 NOTE — Progress Notes (Signed)
Subjective:     Patient ID: Matthew Gross , male    DOB: [birth year] , 38 y.o.   MRN: [id number]   Chief Complaint  Patient presents with  . Hypertension    HPI  He has been having rectal bleeding per his mom - she has been giving him miralax but had changed his scheduled.  He denied he had been straining.  She feels he is doing better started 2 weeks ago.  Hypertension This is a chronic problem. The current episode started more than 1 year ago. The problem is unchanged. The problem is controlled. Pertinent negatives include no anxiety or headaches. There are no associated agents to hypertension. Risk factors for coronary artery disease include sedentary lifestyle and male gender. Past treatments include ACE inhibitors. The current treatment provides no improvement. There are no compliance problems.  There is no history of angina. There is no history of chronic renal disease.     Past Medical History:  Diagnosis Date  . Autistic disorder PT GOES TO A DAY PROGRAM DAILY AND HAS ASSISTANCE AT HOME IN THE EVERY   PT MOTHER LEGAL [place]  . Colon polyps   . Depressed bipolar II disorder ([place])   . Hyperlipidemia   . Hypertension   . Penile cyst   . Rectal bleeding 2017   before and colonscopy     Family History  Problem Relation Age of Onset  . Heart failure Mother   . Hypertension Mother   . Hyperlipidemia Mother      Current Outpatient Medications:  .  atorvastatin (LIPITOR) 10 MG tablet, Take 1 tablet (10 mg total) by mouth daily., Disp: 90 tablet, Rfl: 1 .  buPROPion (WELLBUTRIN XL) 300 MG 24 hr tablet, Take 300 mg by mouth daily. , Disp: , Rfl:  .  gabapentin (NEURONTIN) 400 MG capsule, Take 800 mg by mouth at bedtime. , Disp: , Rfl: 0 .  Loratadine (CLARITIN PO), Take 1 capsule by mouth daily., Disp: , Rfl:  .  risperiDONE (RISPERDAL) 1 MG tablet, Take 1 mg by mouth every other day. , Disp: , Rfl: 0 .  sertraline (ZOLOFT) 100 MG tablet, Take 150 mg by mouth daily., Disp:  , Rfl: 0 .  trandolapril (MAVIK) 2 MG tablet, Take 1 tablet (2 mg total) by mouth daily., Disp: 90 tablet, Rfl: 1 .  Vitamin D, Ergocalciferol, (DRISDOL) 1.25 MG (50000 UT) CAPS capsule, TAKE 1 CAPSULE BY MOUTH EVERY 7 DAYS, Disp: 12 capsule, Rfl: 1   Allergies  Allergen Reactions  . Penicillins Hives    Has patient had a PCN reaction causing immediate rash, facial/tongue/throat swelling, SOB or lightheadedness with hypotension: Yes Has patient had a PCN reaction causing severe rash involving mucus membranes or skin necrosis: No Has patient had a PCN reaction that required hospitalization No Has patient had a PCN reaction occurring within the last 10 years: Yes If all of the above answers are "NO", then may proceed with Cephalosporin use.  . Robaxin [Methocarbamol] Rash     Review of Systems  Constitutional: Negative.   Respiratory: Negative.   Cardiovascular: Negative.   Gastrointestinal:       Per mom had blood in his stool 2 weeks ago.  Musculoskeletal: Negative.   Neurological: Negative for dizziness and headaches.  Psychiatric/Behavioral: Negative.        Scheduled to see behavioral health on Thursday for medication review     Today's Vitals   05/31/19 0935  BP: 122/68  Pulse: 77  Temp: 98.4 F (36.9 C)  TempSrc: Oral  SpO2: 97%  Weight: 185 lb 9.6 oz (84.2 kg)  Height: 5' 8.6" (1.742 m)  PainSc: 0-No pain   Body mass index is 27.73 kg/m.   Objective:  Physical Exam Cardiovascular:     Rate and Rhythm: Normal rate and regular rhythm.     Pulses: Normal pulses.     Heart sounds: Normal heart sounds.  Pulmonary:     Effort: Pulmonary effort is normal.     Breath sounds: Normal breath sounds.  Skin:    General: Skin is warm and dry.     Capillary Refill: Capillary refill takes less than 2 seconds.  Neurological:     General: No focal deficit present.     Mental Status: He is alert and oriented to person, place, and time.  Psychiatric:        Mood and  Affect: Mood normal.        Behavior: Behavior normal.        Thought Content: Thought content normal.        Judgment: Judgment normal.         Assessment And Plan:   1. Essential hypertension . B/P is well controlled.   . The importance of regular exercise and dietary modification was stressed to the patient - trandolapril (MAVIK) 2 MG tablet; Take 1 tablet (2 mg total) by mouth daily.  Dispense: 90 tablet; Refill: 1 - CMP14+EGFR  2. Mixed hyperlipidemia Chronic, controlled Continue with current medications No current issues with medications - Lipid panel - atorvastatin (LIPITOR) 10 MG tablet; Take 1 tablet (10 mg total) by mouth daily.  Dispense: 90 tablet; Refill: 1 - CMP14+EGFR  3. Vitamin D deficiency  Will check vitamin D level and supplement as needed.     Also encouraged to spend 15 minutes in the sun daily.  - VITAMIN D 25 Hydroxy (Vit-D Deficiency, Fractures)  4. Need for influenza vaccination  Influenza vaccine given in office  Advised to take Tylenol as needed for muscle aches or fever - Flu Vaccine QUAD 6+ mos PF IM (Fluarix Quad PF)    [name], FNP    THE PATIENT IS ENCOURAGED TO PRACTICE SOCIAL DISTANCING DUE TO THE COVID-19 PANDEMIC.

## 2019-06-01 LAB — CMP14+EGFR
ALT: 23 IU/L (ref 0–44)
AST: 23 IU/L (ref 0–40)
Albumin/Globulin Ratio: 1.7 (ref 1.2–2.2)
Albumin: 4.5 g/dL (ref 4.0–5.0)
Alkaline Phosphatase: 60 IU/L (ref 39–117)
BUN/Creatinine Ratio: 7 — ABNORMAL LOW (ref 9–20)
BUN: 8 mg/dL (ref 6–20)
Bilirubin Total: 0.6 mg/dL (ref 0.0–1.2)
CO2: 25 mmol/L (ref 20–29)
Calcium: 9.7 mg/dL (ref 8.7–10.2)
Chloride: 101 mmol/L (ref 96–106)
Creatinine, Ser: 1.13 mg/dL (ref 0.76–1.27)
GFR calc Af Amer: 95 mL/min/{1.73_m2} (ref >59)
GFR calc non Af Amer: 83 mL/min/{1.73_m2} (ref >59)
Globulin, Total: 2.7 g/dL (ref 1.5–4.5)
Glucose: 67 mg/dL (ref 65–99)
Potassium: 4.9 mmol/L (ref 3.5–5.2)
Sodium: 137 mmol/L (ref 134–144)
Total Protein: 7.2 g/dL (ref 6.0–8.5)

## 2019-06-01 LAB — LIPID PANEL
Chol/HDL Ratio: 2.3 ratio (ref 0.0–5.0)
Cholesterol, Total: 130 mg/dL (ref 100–199)
HDL: 56 mg/dL (ref >39)
LDL Chol Calc (NIH): 60 mg/dL (ref 0–99)
Triglycerides: 71 mg/dL (ref 0–149)
VLDL Cholesterol Cal: 14 mg/dL (ref 5–40)

## 2019-06-01 LAB — VITAMIN D 25 HYDROXY (VIT D DEFICIENCY, FRACTURES): Vit D, 25-Hydroxy: 56.6 ng/mL (ref 30.0–100.0)

## 2019-06-02 ENCOUNTER — Telehealth: Payer: Self-pay

## 2019-06-02 NOTE — Telephone Encounter (Signed)
-----   Message from [place], [place] sent at 06/02/2019  9:07 AM EDT -----  ----- Message ----- From: [name], FNP Sent: 06/01/2019   9:06 AM EDT To: Waylan Rocher, CMA  Cholesterol levels are normal.  Vitamin d is normal.  Kidney and liver functions are normal.

## 2019-06-02 NOTE — Telephone Encounter (Signed)
1st attempt to give lab results  

## 2019-06-02 NOTE — Telephone Encounter (Signed)
The pt's mother Matthew Gross was notified. Cholesterol levels are normal.  Vitamin d is normal.  Kidney and liver functions are normal.

## 2019-10-24 ENCOUNTER — Encounter: Payer: Self-pay | Admitting: Nurse Practitioner

## 2019-10-24 ENCOUNTER — Other Ambulatory Visit: Payer: Self-pay

## 2019-10-24 ENCOUNTER — Ambulatory Visit (INDEPENDENT_AMBULATORY_CARE_PROVIDER_SITE_OTHER): Payer: Medicaid Other | Admitting: Nurse Practitioner

## 2019-10-24 VITALS — BP 122/78 | HR 88 | Temp 97.8°F | Ht 68.6 in | Wt 194.0 lb

## 2019-10-24 DIAGNOSIS — I10 Essential (primary) hypertension: Secondary | ICD-10-CM | POA: Diagnosis not present

## 2019-10-24 DIAGNOSIS — E559 Vitamin D deficiency, unspecified: Secondary | ICD-10-CM

## 2019-10-24 DIAGNOSIS — F3181 Bipolar II disorder: Secondary | ICD-10-CM

## 2019-10-24 DIAGNOSIS — Z0000 Encounter for general adult medical examination without abnormal findings: Secondary | ICD-10-CM

## 2019-10-24 DIAGNOSIS — F840 Autistic disorder: Secondary | ICD-10-CM

## 2019-10-24 DIAGNOSIS — E782 Mixed hyperlipidemia: Secondary | ICD-10-CM | POA: Diagnosis not present

## 2019-10-24 DIAGNOSIS — Z79899 Other long term (current) drug therapy: Secondary | ICD-10-CM

## 2019-10-24 LAB — POCT URINALYSIS DIPSTICK
Bilirubin, UA: NEGATIVE
Glucose, UA: NEGATIVE
Ketones, UA: NEGATIVE
Leukocytes, UA: NEGATIVE
Nitrite, UA: NEGATIVE
Protein, UA: NEGATIVE
Spec Grav, UA: 1.015 (ref 1.010–1.025)
Urobilinogen, UA: 0.2 E.U./dL
pH, UA: 6.5 (ref 5.0–8.0)

## 2019-10-24 LAB — POCT UA - MICROALBUMIN
Albumin/Creatinine Ratio, Urine, POC: 30
Creatinine, POC: 50 mg/dL
Microalbumin Ur, POC: 10 mg/L

## 2019-10-24 MED ORDER — TRANDOLAPRIL 2 MG PO TABS
2.0000 mg | ORAL_TABLET | Freq: Every day | ORAL | 1 refills | Status: DC
Start: 2019-10-24 — End: 2020-04-24

## 2019-10-24 NOTE — Progress Notes (Signed)
Subjective:     Patient ID: Matthew Gross , male    DOB: May 26, 1981 , 39 y.o.   MRN: [id number]   Chief Complaint  Patient presents with  . Annual Exam    HPI  Here for HM  He had one covid vaccine due for second on April 8th - did well.  Hypertension This is a chronic problem. The current episode started more than 1 year ago. The problem is unchanged. The problem is controlled. Pertinent negatives include no anxiety or headaches. There are no associated agents to hypertension. Risk factors for coronary artery disease include sedentary lifestyle and male gender. Past treatments include ACE inhibitors. The current treatment provides no improvement. There are no compliance problems.  There is no history of angina. There is no history of chronic renal disease.    Men's preventive visit. Patient Health Questionnaire (PHQ-2) is    Office Visit from 05/31/2019 in [facility]  PHQ-2 Total Score  0     Patient is on a Regular diet, no salt.  Drinks adequate amounts of water.  He is exercising daily on a stationary bike.  Marital status: Single. Lives with his mother. Relevant history for alcohol use is:  Social History   Substance and Sexual Activity  Alcohol Use No   Relevant history for tobacco use is:  Social History   Tobacco Use  Smoking Status Never Smoker  Smokeless Tobacco Never Used   Past Medical History:  Diagnosis Date  . Autistic disorder PT GOES TO A DAY PROGRAM DAILY AND HAS ASSISTANCE AT HOME IN THE EVERY   PT MOTHER LEGAL [place]  . Colon polyps   . Depressed bipolar II disorder ([place])   . Hyperlipidemia   . Hypertension   . Penile cyst   . Rectal bleeding 2017   before and colonscopy     Family History  Problem Relation Age of Onset  . Heart failure Mother   . Hypertension Mother   . Hyperlipidemia Mother      Current Outpatient Medications:  .  atorvastatin (LIPITOR) 10 MG tablet, Take 1 tablet (10 mg total) by mouth daily.,  Disp: 90 tablet, Rfl: 1 .  buPROPion (WELLBUTRIN XL) 300 MG 24 hr tablet, Take 300 mg by mouth daily. , Disp: , Rfl:  .  gabapentin (NEURONTIN) 400 MG capsule, Take 800 mg by mouth at bedtime. , Disp: , Rfl: 0 .  Loratadine (CLARITIN PO), Take 1 capsule by mouth daily., Disp: , Rfl:  .  risperiDONE (RISPERDAL) 1 MG tablet, Take 1 mg by mouth every other day. , Disp: , Rfl: 0 .  sertraline (ZOLOFT) 100 MG tablet, Take 150 mg by mouth daily., Disp: , Rfl: 0 .  trandolapril (MAVIK) 2 MG tablet, Take 1 tablet (2 mg total) by mouth daily., Disp: 90 tablet, Rfl: 1 .  Vitamin D, Ergocalciferol, (DRISDOL) 1.25 MG (50000 UT) CAPS capsule, TAKE 1 CAPSULE BY MOUTH EVERY 7 DAYS, Disp: 12 capsule, Rfl: 1   Allergies  Allergen Reactions  . Penicillins Hives    Has patient had a PCN reaction causing immediate rash, facial/tongue/throat swelling, SOB or lightheadedness with hypotension: Yes Has patient had a PCN reaction causing severe rash involving mucus membranes or skin necrosis: No Has patient had a PCN reaction that required hospitalization No Has patient had a PCN reaction occurring within the last 10 years: Yes If all of the above answers are "NO", then may proceed with Cephalosporin use.  . Robaxin [Methocarbamol]  Rash     Review of Systems  Constitutional: Negative.   HENT: Negative.   Eyes: Negative.   Respiratory: Negative.  Negative for cough.   Cardiovascular: Negative.   Gastrointestinal:       Per mother every once in a while will have blood in stool, will give him miralax.   Genitourinary: Negative.   Musculoskeletal: Negative.   Skin: Negative.   Neurological: Negative for dizziness and headaches.  Psychiatric/Behavioral: Negative.        Continues with behavioral health.     Today's Vitals   10/24/19 0909  BP: 122/78  Pulse: 88  Temp: 97.8 F (36.6 C)  TempSrc: Oral  SpO2: 96%  Weight: 194 lb (88 kg)  Height: 5' 8.6" (1.742 m)   Body mass index is 28.98 kg/m.    Objective:  Physical Exam Constitutional:      General: He is not in acute distress.    Appearance: Normal appearance.  HENT:     Head: Normocephalic.     Right Ear: Tympanic membrane, ear canal and external ear normal. There is no impacted cerumen.     Left Ear: Tympanic membrane, ear canal and external ear normal. There is no impacted cerumen.     Nose:     Comments: Deferred - mask    Mouth/Throat:     Comments: Deferred - mask Eyes:     Extraocular Movements: Extraocular movements intact.     Conjunctiva/sclera: Conjunctivae normal.     Pupils: Pupils are equal, round, and reactive to light.  Cardiovascular:     Rate and Rhythm: Normal rate and regular rhythm.     Pulses: Normal pulses.     Heart sounds: Normal heart sounds. No murmur.  Pulmonary:     Effort: Pulmonary effort is normal.     Breath sounds: Normal breath sounds.  Abdominal:     General: Abdomen is flat. Bowel sounds are normal. There is no distension.     Palpations: Abdomen is soft. There is no mass.  Musculoskeletal:        General: Normal range of motion.     Cervical back: Normal range of motion and neck supple. No rigidity.  Skin:    General: Skin is warm and dry.     Capillary Refill: Capillary refill takes less than 2 seconds.  Neurological:     General: No focal deficit present.     Mental Status: He is alert and oriented to person, place, and time.     Cranial Nerves: No cranial nerve deficit.  Psychiatric:        Mood and Affect: Mood normal.        Behavior: Behavior normal.        Thought Content: Thought content normal.        Judgment: Judgment normal.          Assessment And Plan:     1. Health maintenance examination . Behavior modifications discussed and diet history reviewed.   . Pt will continue to exercise regularly and modify diet with low GI, plant based foods and decrease intake of processed foods.  . Recommend intake of daily multivitamin, Vitamin D, and calcium.   . Recommend colonoscopy (already done due to family history) for preventive screenings, as well as recommend immunizations that include influenza, TDAP  2. Essential hypertension . B/P is controlled.  . CMP ordered to check renal function.  . Continue with regular exercise and dietary modification   . EKG done with  [facility] - trandolapril (MAVIK) 2 MG tablet; Take 1 tablet (2 mg total) by mouth daily.  Dispense: 90 tablet; Refill: 1  3. Bipolar II disorder ([place])  Continue with follow up with behavioral health  4. Mixed hyperlipidemia  Chronic, controlled  Continue with current medications - atorvastatin (LIPITOR) 10 MG tablet; Take 1 tablet (10 mg total) by mouth daily.  Dispense: 90 tablet; Refill: 1  5. Autistic disorder  Stable and is followed at behavioral health, no issues  Attends day program regularly  6. Vitamin D deficiency  Will check vitamin D level and supplement as needed.     Also encouraged to spend 15 minutes in the sun daily.  - VITAMIN D 25 Hydroxy (Vit-D Deficiency, Fractures)  7. Encounter for long-term (current) use of medications  - Lipid panel - Hemoglobin A1c - CBC with Differential/Platelet   [name], FNP    [name], FNP    THE PATIENT IS ENCOURAGED TO PRACTICE SOCIAL DISTANCING DUE TO THE COVID-19 PANDEMIC.

## 2019-10-24 NOTE — Patient Instructions (Signed)

## 2019-10-25 ENCOUNTER — Other Ambulatory Visit: Payer: Self-pay | Admitting: Nurse Practitioner

## 2019-10-25 DIAGNOSIS — E559 Vitamin D deficiency, unspecified: Secondary | ICD-10-CM

## 2019-10-25 LAB — CMP14+EGFR
ALT: 33 IU/L (ref 0–44)
AST: 25 IU/L (ref 0–40)
Albumin/Globulin Ratio: 1.9 (ref 1.2–2.2)
Albumin: 4.8 g/dL (ref 4.0–5.0)
Alkaline Phosphatase: 71 IU/L (ref 39–117)
BUN/Creatinine Ratio: 11 (ref 9–20)
BUN: 11 mg/dL (ref 6–20)
Bilirubin Total: 0.5 mg/dL (ref 0.0–1.2)
CO2: 25 mmol/L (ref 20–29)
Calcium: 9.9 mg/dL (ref 8.7–10.2)
Chloride: 101 mmol/L (ref 96–106)
Creatinine, Ser: 1 mg/dL (ref 0.76–1.27)
GFR calc Af Amer: 111 mL/min/{1.73_m2} (ref >59)
GFR calc non Af Amer: 96 mL/min/{1.73_m2} (ref >59)
Globulin, Total: 2.5 g/dL (ref 1.5–4.5)
Glucose: 78 mg/dL (ref 65–99)
Potassium: 4.9 mmol/L (ref 3.5–5.2)
Sodium: 138 mmol/L (ref 134–144)
Total Protein: 7.3 g/dL (ref 6.0–8.5)

## 2019-10-25 LAB — CBC WITH DIFFERENTIAL/PLATELET
Basophils Absolute: 0 10*3/uL (ref 0.0–0.2)
Basos: 1 %
EOS (ABSOLUTE): 0.2 10*3/uL (ref 0.0–0.4)
Eos: 5 %
Hematocrit: 44.1 % (ref 37.5–51.0)
Hemoglobin: 15.2 g/dL (ref 13.0–17.7)
Immature Grans (Abs): 0 10*3/uL (ref 0.0–0.1)
Immature Granulocytes: 0 %
Lymphocytes Absolute: 1.3 10*3/uL (ref 0.7–3.1)
Lymphs: 39 %
MCH: 29.3 pg (ref 26.6–33.0)
MCHC: 34.5 g/dL (ref 31.5–35.7)
MCV: 85 fL (ref 79–97)
Monocytes Absolute: 0.4 10*3/uL (ref 0.1–0.9)
Monocytes: 12 %
Neutrophils Absolute: 1.5 10*3/uL (ref 1.4–7.0)
Neutrophils: 43 %
Platelets: 187 10*3/uL (ref 150–450)
RBC: 5.19 x10E6/uL (ref 4.14–5.80)
RDW: 12.8 % (ref 11.6–15.4)
WBC: 3.4 10*3/uL (ref 3.4–10.8)

## 2019-10-25 LAB — LIPID PANEL
Chol/HDL Ratio: 2.1 ratio (ref 0.0–5.0)
Cholesterol, Total: 142 mg/dL (ref 100–199)
HDL: 67 mg/dL (ref >39)
LDL Chol Calc (NIH): 60 mg/dL (ref 0–99)
Triglycerides: 80 mg/dL (ref 0–149)
VLDL Cholesterol Cal: 15 mg/dL (ref 5–40)

## 2019-10-25 LAB — HEMOGLOBIN A1C
Est. average glucose Bld gHb Est-mCnc: 97 mg/dL
Hgb A1c MFr Bld: 5 % (ref 4.8–5.6)

## 2019-10-25 LAB — VITAMIN D 25 HYDROXY (VIT D DEFICIENCY, FRACTURES): Vit D, 25-Hydroxy: 68.2 ng/mL (ref 30.0–100.0)

## 2019-10-25 MED ORDER — VITAMIN D (ERGOCALCIFEROL) 1.25 MG (50000 UNIT) PO CAPS: 50000.0000 [IU] | ORAL_CAPSULE | ORAL | 1 refills | Status: DC

## 2019-12-16 ENCOUNTER — Other Ambulatory Visit: Payer: Self-pay | Admitting: Nurse Practitioner

## 2019-12-16 DIAGNOSIS — E782 Mixed hyperlipidemia: Secondary | ICD-10-CM

## 2020-04-18 ENCOUNTER — Telehealth: Payer: Self-pay

## 2020-04-18 NOTE — Telephone Encounter (Signed)
LVM for pt mom to call the office with pt insurance information

## 2020-04-24 ENCOUNTER — Other Ambulatory Visit: Payer: Self-pay

## 2020-04-24 ENCOUNTER — Ambulatory Visit (INDEPENDENT_AMBULATORY_CARE_PROVIDER_SITE_OTHER): Payer: Medicaid Other | Admitting: Nurse Practitioner

## 2020-04-24 ENCOUNTER — Telehealth: Payer: Self-pay

## 2020-04-24 ENCOUNTER — Encounter: Payer: Self-pay | Admitting: Nurse Practitioner

## 2020-04-24 VITALS — BP 122/86 | HR 67 | Temp 98.3°F | Ht 69.2 in | Wt 191.6 lb

## 2020-04-24 DIAGNOSIS — Z23 Encounter for immunization: Secondary | ICD-10-CM

## 2020-04-24 DIAGNOSIS — Z1159 Encounter for screening for other viral diseases: Secondary | ICD-10-CM

## 2020-04-24 DIAGNOSIS — E559 Vitamin D deficiency, unspecified: Secondary | ICD-10-CM | POA: Diagnosis not present

## 2020-04-24 DIAGNOSIS — F3181 Bipolar II disorder: Secondary | ICD-10-CM

## 2020-04-24 DIAGNOSIS — I10 Essential (primary) hypertension: Secondary | ICD-10-CM | POA: Diagnosis not present

## 2020-04-24 DIAGNOSIS — E782 Mixed hyperlipidemia: Secondary | ICD-10-CM

## 2020-04-24 MED ORDER — VITAMIN D (ERGOCALCIFEROL) 1.25 MG (50000 UNIT) PO CAPS: 50000.0000 [IU] | ORAL_CAPSULE | ORAL | 1 refills | Status: DC

## 2020-04-24 MED ORDER — TRANDOLAPRIL 2 MG PO TABS: 2.0000 mg | ORAL_TABLET | Freq: Every day | ORAL | 1 refills | Status: DC

## 2020-04-24 MED ORDER — ATORVASTATIN CALCIUM 10 MG PO TABS: 10.0000 mg | ORAL_TABLET | Freq: Every day | ORAL | 1 refills | Status: DC

## 2020-04-24 NOTE — Progress Notes (Signed)
I,Matthew Gross Matthew Gross as a Education administrator for [employer], FNP.,have documented all relevant documentation on the behalf of Matthew Brine, FNP,as directed by  Matthew Brine, FNP while in the presence of Matthew Gross, Matthew Gross. This visit occurred during the SARS-CoV-2 public health emergency.  Safety protocols were in place, including screening questions prior to the visit, additional usage of staff PPE, and extensive cleaning of exam room while observing appropriate contact time as indicated for disinfecting solutions.  Subjective:     Patient ID: Matthew Gross , male    DOB: 27-Mar-1981 , 39 y.o.   MRN: [id number]   Chief Complaint  Patient presents with  . Hypertension    HPI  Her home phone was out for the last week.    Hypertension This is a chronic problem. The current episode started more than 1 year ago. The problem is unchanged. The problem is controlled. Pertinent negatives include no anxiety, chest pain or palpitations. There are no associated agents to hypertension. Risk factors for coronary artery disease include sedentary lifestyle. Past treatments include ACE inhibitors. The current treatment provides significant improvement. There are no compliance problems.  There is no history of angina or kidney disease. There is no history of chronic renal disease.     Past Medical History:  Diagnosis Date  . Autistic disorder PT GOES TO A DAY PROGRAM DAILY AND HAS ASSISTANCE AT HOME IN THE EVERY   PT MOTHER LEGAL Matthew Gross  . Colon polyps   . Depressed bipolar II disorder (Matthew Gross)   . Hyperlipidemia   . Hypertension   . Penile cyst   . Rectal bleeding 2017   before and colonscopy     Family History  Problem Relation Age of Onset  . Heart failure Mother   . Hypertension Mother   . Hyperlipidemia Mother      Current Outpatient Medications:  .  atorvastatin (LIPITOR) 10 MG tablet, Take 1 tablet (10 mg total) by mouth daily., Disp: 90 tablet, Rfl: 1 .  buPROPion (WELLBUTRIN XL) 300  MG 24 hr tablet, Take 300 mg by mouth daily. , Disp: , Rfl:  .  gabapentin (NEURONTIN) 400 MG capsule, Take 800 mg by mouth at bedtime. , Disp: , Rfl: 0 .  Loratadine (CLARITIN PO), Take 1 capsule by mouth daily., Disp: , Rfl:  .  risperiDONE (RISPERDAL) 1 MG tablet, Take 1 mg by mouth every other day. , Disp: , Rfl: 0 .  sertraline (ZOLOFT) 100 MG tablet, Take 150 mg by mouth daily., Disp: , Rfl: 0 .  trandolapril (MAVIK) 2 MG tablet, Take 1 tablet (2 mg total) by mouth daily., Disp: 90 tablet, Rfl: 1 .  Vitamin D, Ergocalciferol, (DRISDOL) 1.25 MG (50000 UNIT) CAPS capsule, Take 1 capsule (50,000 Units total) by mouth every 7 (seven) days., Disp: 12 capsule, Rfl: 1   Allergies  Allergen Reactions  . Penicillins Hives    Has patient had a PCN reaction causing immediate rash, facial/tongue/throat swelling, SOB or lightheadedness with hypotension: Yes Has patient had a PCN reaction causing severe rash involving mucus membranes or skin necrosis: No Has patient had a PCN reaction that required hospitalization No Has patient had a PCN reaction occurring within the last 10 years: Yes If all of the above answers are "NO", then may proceed with Cephalosporin use.  . Robaxin [Methocarbamol] Rash     Review of Systems  Constitutional: Negative.   Respiratory: Negative.   Cardiovascular: Negative.  Negative for chest pain, palpitations and leg swelling.  Psychiatric/Behavioral: Negative.      Today's Vitals   04/24/20 1049  BP: 122/86  Pulse: 67  Temp: 98.3 F (36.8 C)  TempSrc: Oral  Weight: 191 lb 9.6 oz (86.9 kg)  Height: 5' 9.2" (1.758 m)  PainSc: 0-No pain   Body mass index is 28.13 kg/m.   Objective:  Physical Exam Vitals reviewed.  Constitutional:      General: He is not in acute distress.    Appearance: Normal appearance.  Cardiovascular:     Rate and Rhythm: Normal rate and regular rhythm.     Pulses: Normal pulses.     Heart sounds: Normal heart sounds.  Pulmonary:      Effort: Pulmonary effort is normal. No respiratory distress.     Breath sounds: Normal breath sounds. No wheezing.  Skin:    General: Skin is warm and dry.     Capillary Refill: Capillary refill takes less than 2 seconds.  Neurological:     General: No focal deficit present.     Mental Status: He is alert and oriented to person, place, and time.     Cranial Nerves: No cranial nerve deficit.  Psychiatric:        Mood and Affect: Mood normal.        Behavior: Behavior normal.        Thought Content: Thought content normal.        Judgment: Judgment normal.     Comments: His mother answers for him         Assessment And Plan:     1. Essential hypertension  Doing well, awaiting approval for his medication from medicaid - CMP14+EGFR - trandolapril (MAVIK) 2 MG tablet; Take 1 tablet (2 mg total) by mouth daily.  Dispense: 90 tablet; Refill: 1  2. Mixed hyperlipidemia  Chronic, controlled  Continue with current medications, tolerating well - Lipid panel - atorvastatin (LIPITOR) 10 MG tablet; Take 1 tablet (10 mg total) by mouth daily.  Dispense: 90 tablet; Refill: 1  3. Vitamin D deficiency  Will check vitamin D level and supplement as needed.     Also encouraged to spend 15 minutes in the sun daily.  - Vitamin D, Ergocalciferol, (DRISDOL) 1.25 MG (50000 UNIT) CAPS capsule; Take 1 capsule (50,000 Units total) by mouth every 7 (seven) days.  Dispense: 12 capsule; Refill: 1  4. Bipolar II disorder ([place] Gross) Continue with follow up with behavioral health  5. Need for influenza vaccination  Influenza vaccine administered  Encouraged to take Tylenol as needed for fever or muscle aches. - Flu Vaccine QUAD 6+ mos PF IM (Fluarix Quad PF)  6. Encounter for hepatitis C screening test for low risk patient  Will check Hepatitis C screening due to recent recommendations to screen all adults 18 years and older - Hepatitis C antibody     Patient was given opportunity to ask  questions. Patient verbalized understanding of the plan and was able to repeat key elements of the plan. All questions were answered to their satisfaction.    Matthew Bradley, FNP, have reviewed all documentation for this visit. The documentation on 05/03/20 for the exam, diagnosis, procedures, and orders are all accurate and complete.  THE PATIENT IS ENCOURAGED TO PRACTICE SOCIAL DISTANCING DUE TO THE COVID-19 PANDEMIC.

## 2020-04-24 NOTE — Telephone Encounter (Signed)
I returned the pt's mom Matthew Gross's call.  I left a message that I was calling to clarify what medication she was checking about getting a refill on at the pharmacy.

## 2020-04-25 ENCOUNTER — Ambulatory Visit: Payer: Medicaid Other | Admitting: Nurse Practitioner

## 2020-04-25 LAB — LIPID PANEL
Chol/HDL Ratio: 2.6 ratio (ref 0.0–5.0)
Cholesterol, Total: 143 mg/dL (ref 100–199)
HDL: 56 mg/dL (ref >39)
LDL Chol Calc (NIH): 75 mg/dL (ref 0–99)
Triglycerides: 58 mg/dL (ref 0–149)
VLDL Cholesterol Cal: 12 mg/dL (ref 5–40)

## 2020-04-25 LAB — CMP14+EGFR
ALT: 24 IU/L (ref 0–44)
AST: 21 IU/L (ref 0–40)
Albumin/Globulin Ratio: 1.7 (ref 1.2–2.2)
Albumin: 4.8 g/dL (ref 4.0–5.0)
Alkaline Phosphatase: 66 IU/L (ref 44–121)
BUN/Creatinine Ratio: 9 (ref 9–20)
BUN: 8 mg/dL (ref 6–20)
Bilirubin Total: 0.5 mg/dL (ref 0.0–1.2)
CO2: 24 mmol/L (ref 20–29)
Calcium: 9.9 mg/dL (ref 8.7–10.2)
Chloride: 98 mmol/L (ref 96–106)
Creatinine, Ser: 0.92 mg/dL (ref 0.76–1.27)
GFR calc Af Amer: 122 mL/min/{1.73_m2} (ref >59)
GFR calc non Af Amer: 105 mL/min/{1.73_m2} (ref >59)
Globulin, Total: 2.8 g/dL (ref 1.5–4.5)
Glucose: 70 mg/dL (ref 65–99)
Potassium: 5.1 mmol/L (ref 3.5–5.2)
Sodium: 132 mmol/L — ABNORMAL LOW (ref 134–144)
Total Protein: 7.6 g/dL (ref 6.0–8.5)

## 2020-04-25 LAB — HEPATITIS C ANTIBODY: Hep C Virus Ab: <0.1 s/co ratio (ref 0.0–0.9)

## 2020-04-26 ENCOUNTER — Telehealth: Payer: Self-pay

## 2020-04-26 NOTE — Telephone Encounter (Signed)
Pt medication was approved trandolapril 2mg  tab was approved pharmacy notified

## 2020-05-14 ENCOUNTER — Ambulatory Visit: Payer: Self-pay

## 2020-05-16 ENCOUNTER — Other Ambulatory Visit (HOSPITAL_COMMUNITY): Payer: Self-pay | Admitting: Internal Medicine

## 2020-05-16 ENCOUNTER — Ambulatory Visit: Payer: Medicaid Other | Attending: Internal Medicine

## 2020-05-16 DIAGNOSIS — Z23 Encounter for immunization: Secondary | ICD-10-CM

## 2020-05-16 NOTE — Progress Notes (Signed)
   Covid-[facility] Vaccination [facility]  Name:  Matthew Gross    MRN: [id number] DOB: 1980/09/12  05/16/2020  Mr. Matthew Gross was observed post Covid-19 immunization for 15 minutes without incident. He was provided with Vaccine Information Sheet and instruction to access the V-Safe system.   Mr. Matthew Gross was instructed to call 911 with any severe reactions post vaccine: Marland Kitchen Difficulty breathing  . Swelling of face and throat  . A fast heartbeat  . A bad rash all over body  . Dizziness and weakness

## 2020-06-12 ENCOUNTER — Other Ambulatory Visit: Payer: Self-pay

## 2020-06-12 DIAGNOSIS — E782 Mixed hyperlipidemia: Secondary | ICD-10-CM

## 2020-06-12 MED ORDER — ATORVASTATIN CALCIUM 10 MG PO TABS: 10.0000 mg | ORAL_TABLET | Freq: Every day | ORAL | 1 refills | Status: DC

## 2020-10-23 ENCOUNTER — Encounter: Payer: Medicaid Other | Admitting: Nurse Practitioner

## 2020-10-25 ENCOUNTER — Encounter: Payer: Medicaid Other | Admitting: Nurse Practitioner

## 2020-11-15 ENCOUNTER — Other Ambulatory Visit: Payer: Self-pay

## 2020-11-15 ENCOUNTER — Encounter: Payer: Self-pay | Admitting: Nurse Practitioner

## 2020-11-15 ENCOUNTER — Ambulatory Visit (INDEPENDENT_AMBULATORY_CARE_PROVIDER_SITE_OTHER): Payer: Medicaid Other | Admitting: Nurse Practitioner

## 2020-11-15 VITALS — BP 136/88 | HR 70 | Temp 98.2°F | Ht 69.2 in | Wt 190.0 lb

## 2020-11-15 DIAGNOSIS — E782 Mixed hyperlipidemia: Secondary | ICD-10-CM

## 2020-11-15 DIAGNOSIS — I10 Essential (primary) hypertension: Secondary | ICD-10-CM

## 2020-11-15 DIAGNOSIS — Z0000 Encounter for general adult medical examination without abnormal findings: Secondary | ICD-10-CM

## 2020-11-15 DIAGNOSIS — F3181 Bipolar II disorder: Secondary | ICD-10-CM

## 2020-11-15 DIAGNOSIS — E559 Vitamin D deficiency, unspecified: Secondary | ICD-10-CM | POA: Diagnosis not present

## 2020-11-15 DIAGNOSIS — Z23 Encounter for immunization: Secondary | ICD-10-CM | POA: Diagnosis not present

## 2020-11-15 DIAGNOSIS — Z79899 Other long term (current) drug therapy: Secondary | ICD-10-CM

## 2020-11-15 DIAGNOSIS — F840 Autistic disorder: Secondary | ICD-10-CM

## 2020-11-15 LAB — POCT URINALYSIS DIPSTICK
Bilirubin, UA: NEGATIVE
Glucose, UA: NEGATIVE
Ketones, UA: NEGATIVE
Leukocytes, UA: NEGATIVE
Nitrite, UA: NEGATIVE
Protein, UA: NEGATIVE
Spec Grav, UA: 1.02 (ref 1.010–1.025)
Urobilinogen, UA: 0.2 E.U./dL
pH, UA: 7 (ref 5.0–8.0)

## 2020-11-15 LAB — POCT UA - MICROALBUMIN
Albumin/Creatinine Ratio, Urine, POC: 30
Creatinine, POC: 200 mg/dL
Microalbumin Ur, POC: 30 mg/L

## 2020-11-15 MED ORDER — ATORVASTATIN CALCIUM 10 MG PO TABS: 10.0000 mg | ORAL_TABLET | Freq: Every day | ORAL | 1 refills | Status: DC

## 2020-11-15 MED ORDER — TETANUS-DIPHTH-ACELL PERTUSSIS 5-2.5-18.5 LF-MCG/0.5 IM SUSY
0.5000 mL | PREFILLED_SYRINGE | Freq: Once | INTRAMUSCULAR | Status: AC
  Administered 2020-11-15: 0.5 mL via INTRAMUSCULAR

## 2020-11-15 MED ORDER — TRANDOLAPRIL 2 MG PO TABS: 2.0000 mg | ORAL_TABLET | Freq: Every day | ORAL | 1 refills | Status: DC

## 2020-11-15 NOTE — Progress Notes (Signed)
This visit occurred during the SARS-CoV-2 public health emergency.  Safety protocols were in place, including screening questions prior to the visit, additional usage of staff PPE, and extensive cleaning of exam room while observing appropriate contact time as indicated for disinfecting solutions.  Subjective:     Patient ID: Matthew Gross , male    DOB: [birth year] , 40 y.o.   MRN: [id number]   Chief Complaint  Patient presents with  . Annual Exam    HPI  Matthew Gross presents for an annual physical.  He denies any changes in health since the last visit.  He reports eating a healthy diet, exercising multiple times per day on a stationary bicycle, and drinking greater than 64oz of water daily.  He endorses medication compliance and denies any side effects. HIV screening was declined.  Tetanus/TDAP due.  EKG today indicates NSR.   Hypertension This is a chronic problem. The current episode started more than 1 year ago. The problem is unchanged. The problem is controlled. Pertinent negatives include no anxiety, chest pain or palpitations. There are no associated agents to hypertension. Risk factors for coronary artery disease include sedentary lifestyle. Past treatments include ACE inhibitors. The current treatment provides significant improvement. There are no compliance problems.  There is no history of angina or kidney disease. There is no history of chronic renal disease.     Past Medical History:  Diagnosis Date  . Autistic disorder PT GOES TO A DAY PROGRAM DAILY AND HAS ASSISTANCE AT HOME IN THE EVERY   PT MOTHER LEGAL [place]  . Colon polyps   . 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 Hyperlipidemia   . Hypertension   . Penile cyst   . Rectal bleeding 2017   before and colonscopy     Family History  Problem Relation Age of Onset  . Heart failure Mother   . Hypertension Mother   . Hyperlipidemia Mother      Current Outpatient Medications:  .  buPROPion (WELLBUTRIN XL) 300  MG 24 hr tablet, Take 300 mg by mouth daily. , Disp: , Rfl:  .  gabapentin (NEURONTIN) 400 MG capsule, Take 800 mg by mouth at bedtime. , Disp: , Rfl: 0 .  Loratadine (CLARITIN PO), Take 1 capsule by mouth daily., Disp: , Rfl:  .  risperiDONE (RISPERDAL) 1 MG tablet, Take 1 mg by mouth every other day. , Disp: , Rfl: 0 .  sertraline (ZOLOFT) 100 MG tablet, Take 150 mg by mouth daily., Disp: , Rfl: 0 .  atorvastatin (LIPITOR) 10 MG tablet, Take 1 tablet (10 mg total) by mouth daily., Disp: 90 tablet, Rfl: 1 .  COVID-19 mRNA vaccine, Pfizer, 30 MCG/0.3ML injection, TO BE ADMINISTERED BY A PHARMACIST, Disp: .3 mL, Rfl: 0 .  trandolapril (MAVIK) 2 MG tablet, Take 1 tablet (2 mg total) by mouth daily., Disp: 90 tablet, Rfl: 1 .  Vitamin D, Ergocalciferol, (DRISDOL) 1.25 MG (50000 UNIT) CAPS capsule, Take 1 capsule (50,000 Units total) by mouth every 7 (seven) days. (Patient not taking: Reported on 11/15/2020), Disp: 12 capsule, Rfl: 1   Allergies  Allergen Reactions  . Penicillins Hives    Has patient had a PCN reaction causing immediate rash, facial/tongue/throat swelling, SOB or lightheadedness with hypotension: Yes Has patient had a PCN reaction causing severe rash involving mucus membranes or skin necrosis: No Has patient had a PCN reaction that required hospitalization No Has patient had a PCN reaction occurring within the last 10 years: Yes If  all of the above answers are "NO", then may proceed with Cephalosporin use.  . Robaxin [Methocarbamol] Rash     Review of Systems  Constitutional: Negative.   HENT: Negative.   Eyes: Negative.   Respiratory: Negative.   Cardiovascular: Negative.  Negative for chest pain and palpitations.  Gastrointestinal: Negative.   Endocrine: Negative.   Genitourinary: Negative.   Musculoskeletal: Negative.   Skin: Negative.   Allergic/Immunologic: Negative.   Neurological: Negative.   Hematological: Negative.   Psychiatric/Behavioral: Negative.         Continues with behavioral health.     Today's Vitals   11/15/20 1021  BP: 136/88  Pulse: 70  Temp: 98.2 F (36.8 C)  TempSrc: Oral  SpO2: 99%  Weight: 190 lb (86.2 kg)  Height: 5' 9.2" (1.758 m)   Body mass index is 27.9 kg/m.   Objective:  Physical Exam Constitutional:      General: He is not in acute distress.    Appearance: Normal appearance.  HENT:     Head: Normocephalic.     Right Ear: Tympanic membrane, ear canal and external ear normal. There is no impacted cerumen.     Left Ear: Tympanic membrane, ear canal and external ear normal. There is no impacted cerumen.     Nose:     Comments: Deferred - mask    Mouth/Throat:     Comments: Deferred - mask Eyes:     Extraocular Movements: Extraocular movements intact.     Conjunctiva/sclera: Conjunctivae normal.     Pupils: Pupils are equal, round, and reactive to light.  Cardiovascular:     Rate and Rhythm: Normal rate and regular rhythm.     Pulses: Normal pulses.     Heart sounds: Normal heart sounds. No murmur heard.   Pulmonary:     Effort: Pulmonary effort is normal.     Breath sounds: Normal breath sounds.  Abdominal:     General: Abdomen is flat. Bowel sounds are normal. There is no distension.     Palpations: Abdomen is soft. There is no mass.  Musculoskeletal:        General: Normal range of motion.     Cervical back: Normal range of motion and neck supple. No rigidity.  Skin:    General: Skin is warm and dry.     Capillary Refill: Capillary refill takes less than 2 seconds.  Neurological:     General: No focal deficit present.     Mental Status: He is alert and oriented to person, place, and time.     Cranial Nerves: No cranial nerve deficit.  Psychiatric:        Mood and Affect: Mood normal.        Behavior: Behavior normal.        Thought Content: Thought content normal.        Judgment: Judgment normal.     Comments: His mother answers most questions for him. He is able to answer simple basic  questions         Assessment And Plan:     1. Health maintenance examination . Behavior modifications discussed and diet history reviewed.   . Pt will continue to exercise regularly and modify diet with low GI, plant based foods and decrease intake of processed foods.  . Recommend intake of daily multivitamin, Vitamin D, and calcium.  . Recommend for preventive screenings, as well as recommend immunizations that include influenza, TDAP (will receive today)  2. Essential hypertension  Chronic, fair  control  Continue with current medications - POCT Urinalysis Dipstick (81002) - POCT UA - Microalbumin - EKG 12-Lead - CMP14+EGFR - trandolapril (MAVIK) 2 MG tablet; Take 1 tablet (2 mg total) by mouth daily.  Dispense: 90 tablet; Refill: 1  3. Mixed hyperlipidemia  Chronic, stable  No current medications, diet controlled - CMP14+EGFR - Lipid panel - atorvastatin (LIPITOR) 10 MG tablet; Take 1 tablet (10 mg total) by mouth daily.  Dispense: 90 tablet; Refill: 1  4. Vitamin D deficiency  Will check vitamin D level and supplement as needed.     Also encouraged to spend 15 minutes in the sun daily.  - VITAMIN D 25 Hydroxy (Vit-D Deficiency, Fractures)  5. Bipolar II disorder ([place])  Continue follow up with behavioral health  6. Autistic disorder  Continue follow up with behavioral health  7. Other long term (current) drug therapy - CBC  8. Encounter for immunization  Will give tetanus vaccine today while in office. TDAP will be administered to adults 84-21 years old every 10 years. - Tdap (BOOSTRIX) injection 0.5 mL     Patient was given opportunity to ask questions. Patient verbalized understanding of the plan and was able to repeat key elements of the plan. All questions were answered to their satisfaction.  [name], FNP   I, [name], FNP, have reviewed all documentation for this visit. The documentation on 11/15/20 for the exam, diagnosis, procedures,  and orders are all accurate and complete.   I have reviewed this encounter including the documentation in this note and/or discussed this patient with the provider. I am certifying that I agree with the content of this note as the primary care nurse practitioner.  [name], DNP, FNP-BC  IF YOU HAVE BEEN REFERRED TO A SPECIALIST, IT MAY TAKE 1-2 WEEKS TO SCHEDULE/PROCESS THE REFERRAL. IF YOU HAVE NOT HEARD FROM US/SPECIALIST IN TWO WEEKS, PLEASE GIVE [place] A CALL AT [phone].   THE PATIENT IS ENCOURAGED TO PRACTICE SOCIAL DISTANCING DUE TO THE COVID-19 PANDEMIC.

## 2020-11-15 NOTE — Progress Notes (Deleted)
Rutherford Nail as a scribe for [name], FNP.,have documented all relevant documentation on the behalf of [name], FNP,as directed by  [name], FNP while in the presence of [name], [place]. This visit occurred during the SARS-CoV-2 public health emergency.  Safety protocols were in place, including screening questions prior to the visit, additional usage of staff PPE, and extensive cleaning of exam room while observing appropriate contact time as indicated for disinfecting solutions.  Subjective:     Patient ID: Matthew Gross , male    DOB: 11-13-1980 , 40 y.o.   MRN: [id number]   Chief Complaint  Patient presents with  . Annual Exam    HPI  Here for HM  Hypertension This is a chronic problem. The current episode started more than 1 year ago. The problem is unchanged. The problem is controlled. Pertinent negatives include no anxiety or headaches. There are no associated agents to hypertension. Risk factors for coronary artery disease include sedentary lifestyle and male gender. Past treatments include ACE inhibitors. The current treatment provides no improvement. There are no compliance problems.  There is no history of angina. There is no history of chronic renal disease.     Past Medical History:  Diagnosis Date  . Autistic disorder PT GOES TO A DAY PROGRAM DAILY AND HAS ASSISTANCE AT HOME IN THE EVERY   PT MOTHER LEGAL [place]  . Colon polyps   . 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 Hyperlipidemia   . Hypertension   . Penile cyst   . Rectal bleeding 2017   before and colonscopy     Family History  Problem Relation Age of Onset  . Heart failure Mother   . Hypertension Mother   . Hyperlipidemia Mother      Current Outpatient Medications:  .  buPROPion (WELLBUTRIN XL) 300 MG 24 hr tablet, Take 300 mg by mouth daily. , Disp: , Rfl:  .  gabapentin (NEURONTIN) 400 MG capsule, Take 800 mg by mouth at bedtime. , Disp: , Rfl: 0 .  Loratadine  (CLARITIN PO), Take 1 capsule by mouth daily., Disp: , Rfl:  .  risperiDONE (RISPERDAL) 1 MG tablet, Take 1 mg by mouth every other day. , Disp: , Rfl: 0 .  sertraline (ZOLOFT) 100 MG tablet, Take 150 mg by mouth daily., Disp: , Rfl: 0 .  atorvastatin (LIPITOR) 10 MG tablet, Take 1 tablet (10 mg total) by mouth daily., Disp: 90 tablet, Rfl: 1 .  COVID-19 mRNA vaccine, Pfizer, 30 MCG/0.3ML injection, TO BE ADMINISTERED BY A PHARMACIST, Disp: .3 mL, Rfl: 0 .  trandolapril (MAVIK) 2 MG tablet, Take 1 tablet (2 mg total) by mouth daily., Disp: 90 tablet, Rfl: 1 .  Vitamin D, Ergocalciferol, (DRISDOL) 1.25 MG (50000 UNIT) CAPS capsule, Take 1 capsule (50,000 Units total) by mouth every 7 (seven) days. (Patient not taking: Reported on 11/15/2020), Disp: 12 capsule, Rfl: 1   Allergies  Allergen Reactions  . Penicillins Hives    Has patient had a PCN reaction causing immediate rash, facial/tongue/throat swelling, SOB or lightheadedness with hypotension: Yes Has patient had a PCN reaction causing severe rash involving mucus membranes or skin necrosis: No Has patient had a PCN reaction that required hospitalization No Has patient had a PCN reaction occurring within the last 10 years: Yes If all of the above answers are "NO", then may proceed with Cephalosporin use.  . Robaxin [Methocarbamol] Rash     Review of Systems  Constitutional: Negative.  Negative for fatigue.  HENT: Negative.   Endocrine: Negative for polydipsia, polyphagia and polyuria.  Musculoskeletal: Negative.   Skin: Negative.   Neurological: Negative for dizziness and headaches.  Psychiatric/Behavioral: Negative.      Today's Vitals   11/15/20 1021  BP: 136/88  Pulse: 70  Temp: 98.2 F (36.8 C)  TempSrc: Oral  SpO2: 99%  Weight: 190 lb (86.2 kg)  Height: 5' 9.2" (1.758 m)   Body mass index is 27.9 kg/m.   Rutherford Nail as a scribe for [name], FNP.,have documented all relevant documentation on the behalf  of [name], FNP,as directed by  [name], FNP while in the presence of [name], [place]. Objective:  Physical Exam Vitals reviewed.  Constitutional:      General: He is not in acute distress.    Appearance: Normal appearance. He is obese.  HENT:     Head: Normocephalic and atraumatic.     Right Ear: Tympanic membrane, ear canal and external ear normal. There is no impacted cerumen.     Left Ear: Tympanic membrane, ear canal and external ear normal. There is no impacted cerumen.  Cardiovascular:     Rate and Rhythm: Normal rate and regular rhythm.     Pulses: Normal pulses.     Heart sounds: Normal heart sounds. No murmur heard.   Pulmonary:     Effort: Pulmonary effort is normal. No respiratory distress.     Breath sounds: Normal breath sounds.  Abdominal:     General: Abdomen is flat. Bowel sounds are normal. There is no distension.     Palpations: Abdomen is soft.  Genitourinary:    Prostate: Normal.     Rectum: Guaiac result negative.  Musculoskeletal:        General: Normal range of motion.     Cervical back: Normal range of motion and neck supple.  Skin:    General: Skin is warm.     Capillary Refill: Capillary refill takes less than 2 seconds.  Neurological:     General: No focal deficit present.     Mental Status: He is alert and oriented to person, place, and time.  Psychiatric:        Mood and Affect: Mood normal.        Behavior: Behavior normal.        Thought Content: Thought content normal.        Judgment: Judgment normal.         Assessment And Plan:     1. Health maintenance examination  2. Essential hypertension - POCT Urinalysis Dipstick (81002) - POCT UA - Microalbumin - EKG 12-Lead - CMP14+EGFR - trandolapril (MAVIK) 2 MG tablet; Take 1 tablet (2 mg total) by mouth daily.  Dispense: 90 tablet; Refill: 1  3. Mixed hyperlipidemia - CMP14+EGFR - Lipid panel - atorvastatin (LIPITOR) 10 MG tablet; Take 1 tablet (10 mg total) by mouth  daily.  Dispense: 90 tablet; Refill: 1  4. Vitamin D deficiency - VITAMIN D 25 Hydroxy (Vit-D Deficiency, Fractures)  5. Bipolar II disorder ([place])  6. Autistic disorder  7. Other long term (current) drug therapy - CBC  He is encouraged to initially strive for BMI less than 30 to decrease cardiac risk. He is advised to exercise no less than 150 minutes per week.   Patient was given opportunity to ask questions. Patient verbalized understanding of the plan and was able to repeat key elements of the plan. All questions were answered to their satisfaction.  [name], FNP   I, [name], FNP, have reviewed all documentation for this visit. The documentation on 11/15/20 for the exam, diagnosis, procedures, and orders are all accurate and complete.   IF YOU HAVE BEEN REFERRED TO A SPECIALIST, IT MAY TAKE 1-2 WEEKS TO SCHEDULE/PROCESS THE REFERRAL. IF YOU HAVE NOT HEARD FROM US/SPECIALIST IN TWO WEEKS, PLEASE GIVE [place] A CALL AT [phone].   THE PATIENT IS ENCOURAGED TO PRACTICE SOCIAL DISTANCING DUE TO THE COVID-19 PANDEMIC.

## 2020-11-15 NOTE — Patient Instructions (Signed)
Health Maintenance, Male Adopting a healthy lifestyle and getting preventive care are important in promoting health and wellness. Ask your health care provider about:  The right schedule for you to have regular tests and exams.  Things you can do on your own to prevent diseases and keep yourself healthy. What should I know about diet, weight, and exercise? Eat a healthy diet  Eat a diet that includes plenty of vegetables, fruits, low-fat dairy products, and lean protein.  Do not eat a lot of foods that are high in solid fats, added sugars, or sodium.   Maintain a healthy weight Body mass index (BMI) is a measurement that can be used to identify possible weight problems. It estimates body fat based on height and weight. Your health care provider can help determine your BMI and help you achieve or maintain a healthy weight. Get regular exercise Get regular exercise. This is one of the most important things you can do for your health. Most adults should:  Exercise for at least 150 minutes each week. The exercise should increase your heart rate and make you sweat (moderate-intensity exercise).  Do strengthening exercises at least twice a week. This is in addition to the moderate-intensity exercise.  Spend less time sitting. Even light physical activity can be beneficial. Watch cholesterol and blood lipids Have your blood tested for lipids and cholesterol at 39 years of age, then have this test every 5 years. You may need to have your cholesterol levels checked more often if:  Your lipid or cholesterol levels are high.  You are older than 40 years of age.  You are at high risk for heart disease. What should I know about cancer screening? Many types of cancers can be detected early and may often be prevented. Depending on your health history and family history, you may need to have cancer screening at various ages. This may include screening for:  Colorectal cancer.  Prostate  cancer.  Skin cancer.  Lung cancer. What should I know about heart disease, diabetes, and high blood pressure? Blood pressure and heart disease  High blood pressure causes heart disease and increases the risk of stroke. This is more likely to develop in people who have high blood pressure readings, are of African descent, or are overweight.  Talk with your health care provider about your target blood pressure readings.  Have your blood pressure checked: ? Every 3-5 years if you are 18-39 years of age. ? Every year if you are 40 years old or older.  If you are between the ages of 65 and 75 and are a current or former smoker, ask your health care provider if you should have a one-time screening for abdominal aortic aneurysm (AAA). Diabetes Have regular diabetes screenings. This checks your fasting blood sugar level. Have the screening done:  Once every three years after age 45 if you are at a normal weight and have a low risk for diabetes.  More often and at a younger age if you are overweight or have a high risk for diabetes. What should I know about preventing infection? Hepatitis B If you have a higher risk for hepatitis B, you should be screened for this virus. Talk with your health care provider to find out if you are at risk for hepatitis B infection. Hepatitis C Blood testing is recommended for:  Everyone born from 1945 through 1965.  Anyone with known risk factors for hepatitis C. Sexually transmitted infections (STIs)  You should be screened each   year for STIs, including gonorrhea and chlamydia, if: ? You are sexually active and are younger than 39 years of age. ? You are older than 39 years of age and your health care provider tells you that you are at risk for this type of infection. ? Your sexual activity has changed since you were last screened, and you are at increased risk for chlamydia or gonorrhea. Ask your health care provider if you are at risk.  Ask your  health care provider about whether you are at high risk for HIV. Your health care provider may recommend a prescription medicine to help prevent HIV infection. If you choose to take medicine to prevent HIV, you should first get tested for HIV. You should then be tested every 3 months for as long as you are taking the medicine. Follow these instructions at home: Lifestyle  Do not use any products that contain nicotine or tobacco, such as cigarettes, e-cigarettes, and chewing tobacco. If you need help quitting, ask your health care provider.  Do not use street drugs.  Do not share needles.  Ask your health care provider for help if you need support or information about quitting drugs. Alcohol use  Do not drink alcohol if your health care provider tells you not to drink.  If you drink alcohol: ? Limit how much you have to 0-2 drinks a day. ? Be aware of how much alcohol is in your drink. In the [place] one drink equals one 12 oz bottle of beer (355 mL), one 5 oz glass of wine (148 mL), or one 1 oz glass of hard liquor (44 mL). General instructions  Schedule regular health, dental, and eye exams.  Stay current with your vaccines.  Tell your health care provider if: ? You often feel depressed. ? You have ever been abused or do not feel safe at home. Summary  Adopting a healthy lifestyle and getting preventive care are important in promoting health and wellness.  Follow your health care provider's instructions about healthy diet, exercising, and getting tested or screened for diseases.  Follow your health care provider's instructions on monitoring your cholesterol and blood pressure. This information is not intended to replace advice given to you by your health care provider. Make sure you discuss any questions you have with your health care provider. Document Revised: 07/14/2018 Document Reviewed: 07/14/2018 Elsevier Patient Education  2021 [place]. Hypertension,  Adult Hypertension is another name for high blood pressure. High blood pressure forces your heart to work harder to pump blood. This can cause problems over time. There are two numbers in a blood pressure reading. There is a top number (systolic) over a bottom number (diastolic). It is best to have a blood pressure that is below 120/80. Healthy choices can help lower your blood pressure, or you may need medicine to help lower it. What are the causes? The cause of this condition is not known. Some conditions may be related to high blood pressure. What increases the risk?  Smoking.  Having type 2 diabetes mellitus, high cholesterol, or both.  Not getting enough exercise or physical activity.  Being overweight.  Having too much fat, sugar, calories, or salt (sodium) in your diet.  Drinking too much alcohol.  Having long-term (chronic) kidney disease.  Having a family history of high blood pressure.  Age. Risk increases with age.  Race. You may be at higher risk if you are African American.  Gender. Men are at higher risk than women before age  35. After age 33, women are at higher risk than men.  Having obstructive sleep apnea.  Stress. What are the signs or symptoms?  High blood pressure may not cause symptoms. Very high blood pressure (hypertensive crisis) may cause: ? Headache. ? Feelings of worry or nervousness (anxiety). ? Shortness of breath. ? Nosebleed. ? A feeling of being sick to your stomach (nausea). ? Throwing up (vomiting). ? Changes in how you see. ? Very bad chest pain. ? Seizures. How is this treated?  This condition is treated by making healthy lifestyle changes, such as: ? Eating healthy foods. ? Exercising more. ? Drinking less alcohol.  Your health care provider may prescribe medicine if lifestyle changes are not enough to get your blood pressure under control, and if: ? Your top number is above 130. ? Your bottom number is above 80.  Your  personal target blood pressure may vary. Follow these instructions at home: Eating and drinking  If told, follow the DASH eating plan. To follow this plan: ? Fill one half of your plate at each meal with fruits and vegetables. ? Fill one fourth of your plate at each meal with whole grains. Whole grains include whole-wheat pasta, brown rice, and whole-grain bread. ? Eat or drink low-fat dairy products, such as skim milk or low-fat yogurt. ? Fill one fourth of your plate at each meal with low-fat (lean) proteins. Low-fat proteins include fish, chicken without skin, eggs, beans, and tofu. ? Avoid fatty meat, cured and processed meat, or chicken with skin. ? Avoid pre-made or processed food.  Eat less than 1,500 mg of salt each day.  Do not drink alcohol if: ? Your doctor tells you not to drink. ? You are pregnant, may be pregnant, or are planning to become pregnant.  If you drink alcohol: ? Limit how much you use to:  0-1 drink a day for women.  0-2 drinks a day for men. ? Be aware of how much alcohol is in your drink. In the [place] one drink equals one 12 oz bottle of beer (355 mL), one 5 oz glass of wine (148 mL), or one 1 oz glass of hard liquor (44 mL).   Lifestyle  Work with your doctor to stay at a healthy weight or to lose weight. Ask your doctor what the best weight is for you.  Get at least 30 minutes of exercise most days of the week. This may include walking, swimming, or biking.  Get at least 30 minutes of exercise that strengthens your muscles (resistance exercise) at least 3 days a week. This may include lifting weights or doing Pilates.  Do not use any products that contain nicotine or tobacco, such as cigarettes, e-cigarettes, and chewing tobacco. If you need help quitting, ask your doctor.  Check your blood pressure at home as told by your doctor.  Keep all follow-up visits as told by your doctor. This is important.   Medicines  Take over-the-counter and  prescription medicines only as told by your doctor. Follow directions carefully.  Do not skip doses of blood pressure medicine. The medicine does not work as well if you skip doses. Skipping doses also puts you at risk for problems.  Ask your doctor about side effects or reactions to medicines that you should watch for. Contact a doctor if you:  Think you are having a reaction to the medicine you are taking.  Have headaches that keep coming back (recurring).  Feel dizzy.  Have swelling in your  ankles.  Have trouble with your vision. Get help right away if you:  Get a very bad headache.  Start to feel mixed up (confused).  Feel weak or numb.  Feel faint.  Have very bad pain in your: ? Chest. ? Belly (abdomen).  Throw up more than once.  Have trouble breathing. Summary  Hypertension is another name for high blood pressure.  High blood pressure forces your heart to work harder to pump blood.  For most people, a normal blood pressure is less than 120/80.  Making healthy choices can help lower blood pressure. If your blood pressure does not get lower with healthy choices, you may need to take medicine. This information is not intended to replace advice given to you by your health care provider. Make sure you discuss any questions you have with your health care provider. Document Revised: 03/31/2018 Document Reviewed: 03/31/2018 Elsevier Patient Education  2021 [employer].

## 2020-11-16 LAB — CMP14+EGFR
ALT: 24 IU/L (ref 0–44)
AST: 21 IU/L (ref 0–40)
Albumin/Globulin Ratio: 1.9 (ref 1.2–2.2)
Albumin: 4.8 g/dL (ref 4.0–5.0)
Alkaline Phosphatase: 72 IU/L (ref 44–121)
BUN/Creatinine Ratio: 11 (ref 9–20)
BUN: 11 mg/dL (ref 6–20)
Bilirubin Total: 0.5 mg/dL (ref 0.0–1.2)
CO2: 24 mmol/L (ref 20–29)
Calcium: 10 mg/dL (ref 8.7–10.2)
Chloride: 105 mmol/L (ref 96–106)
Creatinine, Ser: 0.99 mg/dL (ref 0.76–1.27)
Globulin, Total: 2.5 g/dL (ref 1.5–4.5)
Glucose: 78 mg/dL (ref 65–99)
Potassium: 5.2 mmol/L (ref 3.5–5.2)
Sodium: 141 mmol/L (ref 134–144)
Total Protein: 7.3 g/dL (ref 6.0–8.5)
eGFR: 100 mL/min/{1.73_m2} (ref >59)

## 2020-11-16 LAB — LIPID PANEL
Chol/HDL Ratio: 2.4 ratio (ref 0.0–5.0)
Cholesterol, Total: 151 mg/dL (ref 100–199)
HDL: 64 mg/dL (ref >39)
LDL Chol Calc (NIH): 77 mg/dL (ref 0–99)
Triglycerides: 48 mg/dL (ref 0–149)
VLDL Cholesterol Cal: 10 mg/dL (ref 5–40)

## 2020-11-16 LAB — CBC
Hematocrit: 41.6 % (ref 37.5–51.0)
Hemoglobin: 14.1 g/dL (ref 13.0–17.7)
MCH: 29 pg (ref 26.6–33.0)
MCHC: 33.9 g/dL (ref 31.5–35.7)
MCV: 85 fL (ref 79–97)
Platelets: 184 10*3/uL (ref 150–450)
RBC: 4.87 x10E6/uL (ref 4.14–5.80)
RDW: 13.2 % (ref 11.6–15.4)
WBC: 3.9 10*3/uL (ref 3.4–10.8)

## 2020-11-16 LAB — VITAMIN D 25 HYDROXY (VIT D DEFICIENCY, FRACTURES): Vit D, 25-Hydroxy: 23.1 ng/mL — ABNORMAL LOW (ref 30.0–100.0)

## 2021-04-24 ENCOUNTER — Other Ambulatory Visit: Payer: Self-pay

## 2021-04-24 DIAGNOSIS — I10 Essential (primary) hypertension: Secondary | ICD-10-CM

## 2021-04-24 MED ORDER — TRANDOLAPRIL 2 MG PO TABS: 2.0000 mg | ORAL_TABLET | Freq: Every day | ORAL | 1 refills | Status: DC

## 2021-04-29 ENCOUNTER — Other Ambulatory Visit: Payer: Self-pay

## 2021-04-29 DIAGNOSIS — I10 Essential (primary) hypertension: Secondary | ICD-10-CM

## 2021-04-29 MED ORDER — TRANDOLAPRIL 2 MG PO TABS
2.0000 mg | ORAL_TABLET | Freq: Every day | ORAL | 1 refills | Status: DC
Start: 2021-04-29 — End: 2021-10-30

## 2021-05-07 ENCOUNTER — Telehealth: Payer: Self-pay

## 2021-05-07 NOTE — Telephone Encounter (Signed)
Matthew Gross with [facility] the pt's care coordinator called and said that the pt's mom was having issues getting a refill on the pt's [facility],  she was notified that the rx was sent and that the pt needed a f/u appt this month.  I called and left a detailed message for the pt's mother Matthew Gross to give the office a call to schedule his f/u appt and that his bp med was sent sept 26th, to contact the pharmacy and notify the office if there is any issues.

## 2021-05-28 ENCOUNTER — Other Ambulatory Visit: Payer: Self-pay

## 2021-05-28 ENCOUNTER — Ambulatory Visit (INDEPENDENT_AMBULATORY_CARE_PROVIDER_SITE_OTHER): Payer: Medicaid Other | Admitting: Nurse Practitioner

## 2021-05-28 ENCOUNTER — Encounter: Payer: Self-pay | Admitting: Nurse Practitioner

## 2021-05-28 VITALS — BP 130/70 | HR 72 | Temp 98.1°F | Ht 69.0 in | Wt 191.2 lb

## 2021-05-28 DIAGNOSIS — I10 Essential (primary) hypertension: Secondary | ICD-10-CM | POA: Diagnosis not present

## 2021-05-28 DIAGNOSIS — E782 Mixed hyperlipidemia: Secondary | ICD-10-CM

## 2021-05-28 DIAGNOSIS — Z23 Encounter for immunization: Secondary | ICD-10-CM

## 2021-05-28 DIAGNOSIS — F3181 Bipolar II disorder: Secondary | ICD-10-CM | POA: Diagnosis not present

## 2021-05-28 DIAGNOSIS — E559 Vitamin D deficiency, unspecified: Secondary | ICD-10-CM | POA: Diagnosis not present

## 2021-05-28 NOTE — Patient Instructions (Signed)
Hypertension, Adult Hypertension is another name for high blood pressure. High blood pressure forces your heart to work harder to pump blood. This can cause problems over time. There are two numbers in a blood pressure reading. There is a top number (systolic) over a bottom number (diastolic). It is best to have a blood pressure that is below 120/80. Healthy choices can help lower your blood pressure, or you may need medicine to help lower it. What are the causes? The cause of this condition is not known. Some conditions may be related to high blood pressure. What increases the risk? Smoking. Having type 2 diabetes mellitus, high cholesterol, or both. Not getting enough exercise or physical activity. Being overweight. Having too much fat, sugar, calories, or salt (sodium) in your diet. Drinking too much alcohol. Having long-term (chronic) kidney disease. Having a family history of high blood pressure. Age. Risk increases with age. Race. You may be at higher risk if you are African American. Gender. Men are at higher risk than women before age 45. After age 65, women are at higher risk than men. Having obstructive sleep apnea. Stress. What are the signs or symptoms? High blood pressure may not cause symptoms. Very high blood pressure (hypertensive crisis) may cause: Headache. Feelings of worry or nervousness (anxiety). Shortness of breath. Nosebleed. A feeling of being sick to your stomach (nausea). Throwing up (vomiting). Changes in how you see. Very bad chest pain. Seizures. How is this treated? This condition is treated by making healthy lifestyle changes, such as: Eating healthy foods. Exercising more. Drinking less alcohol. Your health care provider may prescribe medicine if lifestyle changes are not enough to get your blood pressure under control, and if: Your top number is above 130. Your bottom number is above 80. Your personal target blood pressure may vary. Follow  these instructions at home: Eating and drinking  If told, follow the DASH eating plan. To follow this plan: Fill one half of your plate at each meal with fruits and vegetables. Fill one fourth of your plate at each meal with whole grains. Whole grains include whole-wheat pasta, brown rice, and whole-grain bread. Eat or drink low-fat dairy products, such as skim milk or low-fat yogurt. Fill one fourth of your plate at each meal with low-fat (lean) proteins. Low-fat proteins include fish, chicken without skin, eggs, beans, and tofu. Avoid fatty meat, cured and processed meat, or chicken with skin. Avoid pre-made or processed food. Eat less than 1,500 mg of salt each day. Do not drink alcohol if: Your doctor tells you not to drink. You are pregnant, may be pregnant, or are planning to become pregnant. If you drink alcohol: Limit how much you use to: 0-1 drink a day for women. 0-2 drinks a day for men. Be aware of how much alcohol is in your drink. In the [place] one drink equals one 12 oz bottle of beer (355 mL), one 5 oz glass of wine (148 mL), or one 1 oz glass of hard liquor (44 mL). Lifestyle  Work with your doctor to stay at a healthy weight or to lose weight. Ask your doctor what the best weight is for you. Get at least 30 minutes of exercise most days of the week. This may include walking, swimming, or biking. Get at least 30 minutes of exercise that strengthens your muscles (resistance exercise) at least 3 days a week. This may include lifting weights or doing Pilates. Do not use any products that contain nicotine or tobacco, such   as cigarettes, e-cigarettes, and chewing tobacco. If you need help quitting, ask your doctor. Check your blood pressure at home as told by your doctor. Keep all follow-up visits as told by your doctor. This is important. Medicines Take over-the-counter and prescription medicines only as told by your doctor. Follow directions carefully. Do not skip doses of  blood pressure medicine. The medicine does not work as well if you skip doses. Skipping doses also puts you at risk for problems. Ask your doctor about side effects or reactions to medicines that you should watch for. Contact a doctor if you: Think you are having a reaction to the medicine you are taking. Have headaches that keep coming back (recurring). Feel dizzy. Have swelling in your ankles. Have trouble with your vision. Get help right away if you: Get a very bad headache. Start to feel mixed up (confused). Feel weak or numb. Feel faint. Have very bad pain in your: Chest. Belly (abdomen). Throw up more than once. Have trouble breathing. Summary Hypertension is another name for high blood pressure. High blood pressure forces your heart to work harder to pump blood. For most people, a normal blood pressure is less than 120/80. Making healthy choices can help lower blood pressure. If your blood pressure does not get lower with healthy choices, you may need to take medicine. This information is not intended to replace advice given to you by your health care provider. Make sure you discuss any questions you have with your health care provider. Document Revised: 03/31/2018 Document Reviewed: 03/31/2018 Elsevier Patient Education  [place].  Influenza (Flu) Vaccine (Inactivated or Recombinant): What You Need to Know 1. Why get vaccinated? Influenza vaccine can prevent influenza (flu). Flu is a contagious disease that spreads around the [place] every year, usually between October and May. Anyone can get the flu, but it is more dangerous for some people. Infants and young children, people 58 years and older, pregnant people, and people with certain health conditions or a weakened immune system are at greatest risk of flu complications. Pneumonia, bronchitis, sinus infections, and ear infections are examples of flu-related complications. If you have a medical condition, such  as heart disease, cancer, or diabetes, flu can make it worse. Flu can cause fever and chills, sore throat, muscle aches, fatigue, cough, headache, and runny or stuffy nose. Some people may have vomiting and diarrhea, though this is more common in children than adults. In an average year, thousands of people in the [place] die from flu, and many more are hospitalized. Flu vaccine prevents millions of illnesses and flu-related visits to the doctor each year. 2. Influenza vaccines CDC recommends everyone 6 months and older get vaccinated every flu season. Children 6 months through 50 years of age may need 2 doses during a single flu season. Everyone else needs only 1 dose each flu season. It takes about 2 weeks for protection to develop after vaccination. There are many flu viruses, and they are always changing. Each year a new flu vaccine is made to protect against the influenza viruses believed to be likely to cause disease in the upcoming flu season. Even when the vaccine doesn't exactly match these viruses, it may still provide some protection. Influenza vaccine does not cause flu. Influenza vaccine may be given at the same time as other vaccines. 3. Talk with your health care provider Tell your vaccination provider if the person getting the vaccine: Has had an allergic reaction after a previous dose of influenza  vaccine, or has any severe, life-threatening allergies Has ever had Guillain-Barr Syndrome (also called "GBS") In some cases, your health care provider may decide to postpone influenza vaccination until a future visit. Influenza vaccine can be administered at any time during pregnancy. People who are or will be pregnant during influenza season should receive inactivated influenza vaccine. People with minor illnesses, such as a cold, may be vaccinated. People who are moderately or severely ill should usually wait until they recover before getting influenza vaccine. Your health care  provider can give you more information. 4. Risks of a vaccine reaction Soreness, redness, and swelling where the shot is given, fever, muscle aches, and headache can happen after influenza vaccination. There may be a very small increased risk of Guillain-Barr Syndrome (GBS) after inactivated influenza vaccine (the flu shot). Young children who get the flu shot along with pneumococcal vaccine (PCV13) and/or DTaP vaccine at the same time might be slightly more likely to have a seizure caused by fever. Tell your health care provider if a child who is getting flu vaccine has ever had a seizure. People sometimes faint after medical procedures, including vaccination. Tell your provider if you feel dizzy or have vision changes or ringing in the ears. As with any medicine, there is a very remote chance of a vaccine causing a severe allergic reaction, other serious injury, or death. 5. What if there is a serious problem? An allergic reaction could occur after the vaccinated person leaves the clinic. If you see signs of a severe allergic reaction (hives, swelling of the face and throat, difficulty breathing, a fast heartbeat, dizziness, or weakness), call 9-1-1 and get the person to the nearest hospital. For other signs that concern you, call your health care provider. Adverse reactions should be reported to the Vaccine Adverse Event Reporting System (VAERS). Your health care provider will usually file this report, or you can do it yourself. Visit the VAERS website at www.vaers.SamedayNews.es or call (316)205-4393. VAERS is only for reporting reactions, and VAERS staff members do not give medical advice. 6. The National Vaccine Injury Compensation Program The [employer] Vaccine Injury Compensation Program (VICP) is a federal program that was created to compensate people who may have been injured by certain vaccines. Claims regarding alleged injury or death due to vaccination have a time limit for filing, which may be as  short as two years. Visit the VICP website at GoldCloset.com.ee or call 778-012-8379 to learn about the program and about filing a claim. 7. How can I learn more? Ask your health care provider. Call your local or state health department. Visit the website of the Food and Drug Administration (FDA) for vaccine package inserts and additional information at TraderRating.uy. Contact the Centers for Disease Control and Prevention (CDC): Call 930-443-1180 (1-800-CDC-INFO) or Visit CDC's website at https://gibson.com/. Vaccine Information Statement Inactivated Influenza Vaccine (03/09/2020) This information is not intended to replace advice given to you by your health care provider. Make sure you discuss any questions you have with your health care provider. Document Revised: 04/26/2020 Document Reviewed: 04/26/2020 Elsevier Patient Education  2022 [employer].

## 2021-05-28 NOTE — Progress Notes (Signed)
I, Matthew Gross, acting as a Education administrator for [employer], FNP.,have documented all relevant documentation on the behalf of Matthew Brine, FNP,as directed by  Matthew Brine, FNP while in the presence of Matthew Gross, [place].   This visit occurred during the SARS-CoV-2 public health emergency.  Safety protocols were in place, including screening questions prior to the visit, additional usage of staff PPE, and extensive cleaning of exam room while observing appropriate contact time as indicated for disinfecting solutions.  Subjective:     Patient ID: Matthew Gross , male    DOB: August 10, 1980 , 40 y.o.   MRN: [id number]   Chief Complaint  Patient presents with   Hypertension     HPI  Pt here for HTN and cholesterol f/u. He is here today with his mother, no current concerns    Past Medical History:  Diagnosis Date   Autistic disorder PT GOES TO A DAY PROGRAM DAILY AND HAS ASSISTANCE AT HOME IN THE EVERY   PT MOTHER LEGAL GAUDERIAN   Colon polyps    Depressed bipolar II disorder ([place])    Hyperlipidemia    Hypertension    Penile cyst    Rectal bleeding 2017   before and colonscopy     Family History  Problem Relation Age of Onset   Heart failure Mother    Hypertension Mother    Hyperlipidemia Mother      Current Outpatient Medications:    atorvastatin (LIPITOR) 10 MG tablet, Take 1 tablet (10 mg total) by mouth daily., Disp: 90 tablet, Rfl: 1   buPROPion (WELLBUTRIN XL) 300 MG 24 hr tablet, Take 300 mg by mouth daily. , Disp: , Rfl:    gabapentin (NEURONTIN) 400 MG capsule, Take 800 mg by mouth at bedtime. , Disp: , Rfl: 0   Loratadine (CLARITIN PO), Take 1 capsule by mouth daily., Disp: , Rfl:    risperiDONE (RISPERDAL) 1 MG tablet, Take 1 mg by mouth every other day. , Disp: , Rfl: 0   sertraline (ZOLOFT) 100 MG tablet, Take 150 mg by mouth daily., Disp: , Rfl: 0   trandolapril (MAVIK) 2 MG tablet, Take 1 tablet (2 mg total) by mouth daily., Disp: 90 tablet, Rfl: 1   Vitamin D,  Ergocalciferol, (DRISDOL) 1.25 MG (50000 UNIT) CAPS capsule, Take 1 capsule (50,000 Units total) by mouth every 7 (seven) days. (Patient not taking: No sig reported), Disp: 12 capsule, Rfl: 1   Allergies  Allergen Reactions   Penicillins Hives    Has patient had a PCN reaction causing immediate rash, facial/tongue/throat swelling, SOB or lightheadedness with hypotension: Yes Has patient had a PCN reaction causing severe rash involving mucus membranes or skin necrosis: No Has patient had a PCN reaction that required hospitalization No Has patient had a PCN reaction occurring within the last 10 years: Yes If all of the above answers are "NO", then may proceed with Cephalosporin use.   Robaxin [Methocarbamol] Rash     Review of Systems  Constitutional: Negative.   HENT: Negative.    Respiratory: Negative.    Cardiovascular: Negative.   Gastrointestinal: Negative.   Endocrine: Negative.   Skin: Negative.   Neurological: Negative.   Psychiatric/Behavioral: Negative.      Today's Vitals   05/28/21 0905  BP: 130/70  Pulse: 72  Temp: 98.1 F (36.7 C)  Weight: 191 lb 3.2 oz (86.7 kg)  Height: 5' 9"  (1.753 m)  PainSc: 0-No pain   Body mass index is 28.24 kg/m.  Wt Readings  from Last 3 Encounters:  05/28/21 191 lb 3.2 oz (86.7 kg)  11/15/20 190 lb (86.2 kg)  04/24/20 191 lb 9.6 oz (86.9 kg)    Objective:  Physical Exam Vitals reviewed.  Constitutional:      General: He is not in acute distress.    Appearance: Normal appearance.  Cardiovascular:     Rate and Rhythm: Normal rate and regular rhythm.     Pulses: Normal pulses.     Heart sounds: Normal heart sounds.  Pulmonary:     Effort: Pulmonary effort is normal. No respiratory distress.     Breath sounds: Normal breath sounds. No wheezing.  Skin:    General: Skin is warm and dry.     Capillary Refill: Capillary refill takes less than 2 seconds.  Neurological:     General: No focal deficit present.     Mental Status: He  is alert and oriented to person, place, and time.     Cranial Nerves: No cranial nerve deficit.  Psychiatric:        Mood and Affect: Mood normal.        Behavior: Behavior normal.        Thought Content: Thought content normal.        Judgment: Judgment normal.     Comments: His mother answers for him        Assessment And Plan:     1. Essential hypertension Comments: Blood pressure is well controlled, continue current medications - BMP8+eGFR  2. Mixed hyperlipidemia Comments: Within normal range with diet low in fat - BMP8+eGFR - Lipid panel  3. Vitamin D deficiency Will check vitamin D level and supplement as needed.    Also encouraged to spend 15 minutes in the sun daily.  - VITAMIN D 25 Hydroxy (Vit-D Deficiency, Fractures)  4. Bipolar II disorder ([place]) Comments: Controlled per mother, continue follow up with Behavioral Health  5. Immunization due Influenza vaccine administered Encouraged to take Tylenol as needed for fever or muscle aches. - Flu Vaccine QUAD 6+ mos PF IM (Fluarix Quad PF)    Patient was given opportunity to ask questions. Patient verbalized understanding of the plan and was able to repeat key elements of the plan. All questions were answered to their satisfaction.  Matthew Brine, FNP   I, Matthew Brine, FNP, have reviewed all documentation for this visit. The documentation on 05/28/21 for the exam, diagnosis, procedures, and orders are all accurate and complete.   IF YOU HAVE BEEN REFERRED TO A SPECIALIST, IT MAY TAKE 1-2 WEEKS TO SCHEDULE/PROCESS THE REFERRAL. IF YOU HAVE NOT HEARD FROM US/SPECIALIST IN TWO WEEKS, PLEASE GIVE [place] A CALL AT [phone].   THE PATIENT IS ENCOURAGED TO PRACTICE SOCIAL DISTANCING DUE TO THE COVID-19 PANDEMIC.

## 2021-05-29 LAB — BMP8+EGFR
BUN/Creatinine Ratio: 6 — ABNORMAL LOW (ref 9–20)
BUN: 6 mg/dL (ref 6–20)
CO2: 26 mmol/L (ref 20–29)
Calcium: 9.3 mg/dL (ref 8.7–10.2)
Chloride: 103 mmol/L (ref 96–106)
Creatinine, Ser: 1.05 mg/dL (ref 0.76–1.27)
Glucose: 83 mg/dL (ref 70–99)
Potassium: 4.8 mmol/L (ref 3.5–5.2)
Sodium: 140 mmol/L (ref 134–144)
eGFR: 93 mL/min/{1.73_m2} (ref >59)

## 2021-05-29 LAB — VITAMIN D 25 HYDROXY (VIT D DEFICIENCY, FRACTURES): Vit D, 25-Hydroxy: 32.2 ng/mL (ref 30.0–100.0)

## 2021-05-29 LAB — LIPID PANEL
Chol/HDL Ratio: 2.4 ratio (ref 0.0–5.0)
Cholesterol, Total: 134 mg/dL (ref 100–199)
HDL: 56 mg/dL (ref >39)
LDL Chol Calc (NIH): 63 mg/dL (ref 0–99)
Triglycerides: 74 mg/dL (ref 0–149)
VLDL Cholesterol Cal: 15 mg/dL (ref 5–40)

## 2021-06-08 ENCOUNTER — Other Ambulatory Visit: Payer: Self-pay | Admitting: Nurse Practitioner

## 2021-06-08 DIAGNOSIS — E782 Mixed hyperlipidemia: Secondary | ICD-10-CM

## 2021-06-10 ENCOUNTER — Other Ambulatory Visit: Payer: Self-pay

## 2021-06-10 DIAGNOSIS — E782 Mixed hyperlipidemia: Secondary | ICD-10-CM

## 2021-06-10 MED ORDER — ATORVASTATIN CALCIUM 10 MG PO TABS
10.0000 mg | ORAL_TABLET | Freq: Every day | ORAL | 1 refills | Status: DC
Start: 2021-06-10 — End: 2021-11-21

## 2021-10-30 ENCOUNTER — Other Ambulatory Visit: Payer: Self-pay

## 2021-10-30 DIAGNOSIS — I10 Essential (primary) hypertension: Secondary | ICD-10-CM

## 2021-10-30 DIAGNOSIS — E782 Mixed hyperlipidemia: Secondary | ICD-10-CM

## 2021-10-30 MED ORDER — TRANDOLAPRIL 2 MG PO TABS: 2.0000 mg | ORAL_TABLET | Freq: Every day | ORAL | 1 refills | Status: DC

## 2021-10-30 MED ORDER — ATORVASTATIN CALCIUM 10 MG PO TABS: ORAL_TABLET | ORAL | 1 refills | Status: DC

## 2021-11-18 ENCOUNTER — Encounter: Payer: Medicaid Other | Admitting: Nurse Practitioner

## 2021-11-21 ENCOUNTER — Ambulatory Visit (INDEPENDENT_AMBULATORY_CARE_PROVIDER_SITE_OTHER): Payer: Medicaid Other | Admitting: Nurse Practitioner

## 2021-11-21 ENCOUNTER — Encounter: Payer: Self-pay | Admitting: Nurse Practitioner

## 2021-11-21 VITALS — BP 130/78 | HR 78 | Temp 98.1°F | Ht 69.0 in | Wt 191.0 lb

## 2021-11-21 DIAGNOSIS — E782 Mixed hyperlipidemia: Secondary | ICD-10-CM | POA: Diagnosis not present

## 2021-11-21 DIAGNOSIS — Z125 Encounter for screening for malignant neoplasm of prostate: Secondary | ICD-10-CM

## 2021-11-21 DIAGNOSIS — F3181 Bipolar II disorder: Secondary | ICD-10-CM

## 2021-11-21 DIAGNOSIS — I10 Essential (primary) hypertension: Secondary | ICD-10-CM | POA: Diagnosis not present

## 2021-11-21 DIAGNOSIS — Z79899 Other long term (current) drug therapy: Secondary | ICD-10-CM

## 2021-11-21 DIAGNOSIS — Z0000 Encounter for general adult medical examination without abnormal findings: Secondary | ICD-10-CM | POA: Diagnosis not present

## 2021-11-21 DIAGNOSIS — E559 Vitamin D deficiency, unspecified: Secondary | ICD-10-CM | POA: Diagnosis not present

## 2021-11-21 DIAGNOSIS — F840 Autistic disorder: Secondary | ICD-10-CM

## 2021-11-21 DIAGNOSIS — Z114 Encounter for screening for human immunodeficiency virus [HIV]: Secondary | ICD-10-CM

## 2021-11-21 LAB — POCT URINALYSIS DIPSTICK
Bilirubin, UA: NEGATIVE
Blood, UA: NEGATIVE
Glucose, UA: NEGATIVE
Ketones, UA: NEGATIVE
Leukocytes, UA: NEGATIVE
Nitrite, UA: NEGATIVE
Protein, UA: NEGATIVE
Spec Grav, UA: 1.01 (ref 1.010–1.025)
Urobilinogen, UA: 0.2 E.U./dL
pH, UA: 6.5 (ref 5.0–8.0)

## 2021-11-21 MED ORDER — ATORVASTATIN CALCIUM 10 MG PO TABS: ORAL_TABLET | ORAL | 1 refills | Status: DC

## 2021-11-21 MED ORDER — TRANDOLAPRIL 2 MG PO TABS: 2.0000 mg | ORAL_TABLET | Freq: Every day | ORAL | 1 refills | Status: DC

## 2021-11-21 NOTE — Patient Instructions (Signed)
Health Maintenance, Male Adopting a healthy lifestyle and getting preventive care are important in promoting health and wellness. Ask your health care provider about: The right schedule for you to have regular tests and exams. Things you can do on your own to prevent diseases and keep yourself healthy. What should I know about diet, weight, and exercise? Eat a healthy diet  Eat a diet that includes plenty of vegetables, fruits, low-fat dairy products, and lean protein. Do not eat a lot of foods that are high in solid fats, added sugars, or sodium. Maintain a healthy weight Body mass index (BMI) is a measurement that can be used to identify possible weight problems. It estimates body fat based on height and weight. Your health care provider can help determine your BMI and help you achieve or maintain a healthy weight. Get regular exercise Get regular exercise. This is one of the most important things you can do for your health. Most adults should: Exercise for at least 150 minutes each week. The exercise should increase your heart rate and make you sweat (moderate-intensity exercise). Do strengthening exercises at least twice a week. This is in addition to the moderate-intensity exercise. Spend less time sitting. Even light physical activity can be beneficial. Watch cholesterol and blood lipids Have your blood tested for lipids and cholesterol at 40 years of age, then have this test every 5 years. You may need to have your cholesterol levels checked more often if: Your lipid or cholesterol levels are high. You are older than 40 years of age. You are at high risk for heart disease. What should I know about cancer screening? Many types of cancers can be detected early and may often be prevented. Depending on your health history and family history, you may need to have cancer screening at various ages. This may include screening for: Colorectal cancer. Prostate cancer. Skin cancer. Lung  cancer. What should I know about heart disease, diabetes, and high blood pressure? Blood pressure and heart disease High blood pressure causes heart disease and increases the risk of stroke. This is more likely to develop in people who have high blood pressure readings or are overweight. Talk with your health care provider about your target blood pressure readings. Have your blood pressure checked: Every 3-5 years if you are 18-39 years of age. Every year if you are 40 years old or older. If you are between the ages of 65 and 75 and are a current or former smoker, ask your health care provider if you should have a one-time screening for abdominal aortic aneurysm (AAA). Diabetes Have regular diabetes screenings. This checks your fasting blood sugar level. Have the screening done: Once every three years after age 45 if you are at a normal weight and have a low risk for diabetes. More often and at a younger age if you are overweight or have a high risk for diabetes. What should I know about preventing infection? Hepatitis B If you have a higher risk for hepatitis B, you should be screened for this virus. Talk with your health care provider to find out if you are at risk for hepatitis B infection. Hepatitis C Blood testing is recommended for: Everyone born from 1945 through 1965. Anyone with known risk factors for hepatitis C. Sexually transmitted infections (STIs) You should be screened each year for STIs, including gonorrhea and chlamydia, if: You are sexually active and are younger than 40 years of age. You are older than 40 years of age and your   health care provider tells you that you are at risk for this type of infection. Your sexual activity has changed since you were last screened, and you are at increased risk for chlamydia or gonorrhea. Ask your health care provider if you are at risk. Ask your health care provider about whether you are at high risk for HIV. Your health care provider  may recommend a prescription medicine to help prevent HIV infection. If you choose to take medicine to prevent HIV, you should first get tested for HIV. You should then be tested every 3 months for as long as you are taking the medicine. Follow these instructions at home: Alcohol use Do not drink alcohol if your health care provider tells you not to drink. If you drink alcohol: Limit how much you have to 0-2 drinks a day. Know how much alcohol is in your drink. In the [place] one drink equals one 12 oz bottle of beer (355 mL), one 5 oz glass of wine (148 mL), or one 1 oz glass of hard liquor (44 mL). Lifestyle Do not use any products that contain nicotine or tobacco. These products include cigarettes, chewing tobacco, and vaping devices, such as e-cigarettes. If you need help quitting, ask your health care provider. Do not use street drugs. Do not share needles. Ask your health care provider for help if you need support or information about quitting drugs. General instructions Schedule regular health, dental, and eye exams. Stay current with your vaccines. Tell your health care provider if: You often feel depressed. You have ever been abused or do not feel safe at home. Summary Adopting a healthy lifestyle and getting preventive care are important in promoting health and wellness. Follow your health care provider's instructions about healthy diet, exercising, and getting tested or screened for diseases. Follow your health care provider's instructions on monitoring your cholesterol and blood pressure. This information is not intended to replace advice given to you by your health care provider. Make sure you discuss any questions you have with your health care provider. Document Revised: 12/10/2020 Document Reviewed: 12/10/2020 Elsevier Patient Education  2023 Elsevier Inc.  

## 2021-11-21 NOTE — Progress Notes (Signed)
?Industrial/product designer as a Education administrator for [employer], FNP.,have documented all relevant documentation on the behalf of [name], FNP,as directed by  [name], FNP while in the presence of [name], [place]. ? ?This visit occurred during the SARS-CoV-2 public health emergency.  Safety protocols were in place, including screening questions prior to the visit, additional usage of staff PPE, and extensive cleaning of exam room while observing appropriate contact time as indicated for disinfecting solutions. ? ?Subjective:  ?  ? Patient ID: Matthew Gross , male    DOB: 18-Apr-1981 , 41 y.o.   MRN: [id number] ? ? ?Chief Complaint  ?Patient presents with  ? Annual Exam  ? ? ?HPI ? ?Matthew Gross presents for an annual physical.  He denies any changes in health since the last visit.  He reports eating a healthy diet, exercising multiple times per day on a stationary bicycle, and drinking greater than 64oz of water daily.  Continues to go to behavioral health goes every 3 months, no changes.  ? ?Wt Readings from Last 3 Encounters: ?11/21/21 : 191 lb (86.6 kg) ?05/28/21 : 191 lb 3.2 oz (86.7 kg) ?11/15/20 : 190 lb (86.2 kg) ? ? ? ?Hypertension ?This is a chronic problem. The current episode started more than 1 year ago. The problem is unchanged. The problem is controlled. Pertinent negatives include no anxiety, chest pain or palpitations. There are no associated agents to hypertension. Risk factors for coronary artery disease include sedentary lifestyle. Past treatments include ACE inhibitors. The current treatment provides significant improvement. There are no compliance problems.  There is no history of angina or kidney disease. There is no history of chronic renal disease.   ? ?Past Medical History:  ?Diagnosis Date  ? Autistic disorder PT GOES TO A DAY PROGRAM DAILY AND HAS ASSISTANCE AT HOME IN THE EVERY  ? PT MOTHER LEGAL GAUDERIAN  ? Colon polyps   ? Depressed bipolar II disorder ([place])   ? Hyperlipidemia   ?  Hypertension   ? Penile cyst   ? Rectal bleeding 2017  ? before and colonscopy  ?  ? ?Family History  ?Problem Relation Age of Onset  ? Heart failure Mother   ? Hypertension Mother   ? Hyperlipidemia Mother   ? ? ? ?Current Outpatient Medications:  ?  buPROPion (WELLBUTRIN XL) 300 MG 24 hr tablet, Take 300 mg by mouth daily. , Disp: , Rfl:  ?  gabapentin (NEURONTIN) 400 MG capsule, Take 800 mg by mouth at bedtime. , Disp: , Rfl: 0 ?  Loratadine (CLARITIN PO), Take 1 capsule by mouth daily., Disp: , Rfl:  ?  risperiDONE (RISPERDAL) 1 MG tablet, Take 1 mg by mouth every other day. , Disp: , Rfl: 0 ?  sertraline (ZOLOFT) 100 MG tablet, Take 150 mg by mouth daily., Disp: , Rfl: 0 ?  atorvastatin (LIPITOR) 10 MG tablet, TAKE 1 TABLET(10 MG) BY MOUTH DAILY, Disp: 90 tablet, Rfl: 1 ?  trandolapril (MAVIK) 2 MG tablet, Take 1 tablet (2 mg total) by mouth daily., Disp: 90 tablet, Rfl: 1  ? ?Allergies  ?Allergen Reactions  ? Penicillins Hives  ?  Has patient had a PCN reaction causing immediate rash, facial/tongue/throat swelling, SOB or lightheadedness with hypotension: Yes ?Has patient had a PCN reaction causing severe rash involving mucus membranes or skin necrosis: No ?Has patient had a PCN reaction that required hospitalization No ?Has patient had a PCN reaction occurring within the last 10 years: Yes ?If all of the  above answers are "NO", then may proceed with Cephalosporin use.  ? Robaxin [Methocarbamol] Rash  ?  ? ?Men's preventive visit. Patient Health Questionnaire (PHQ-2) is  ?[place] Office Visit from 05/31/2019 in [facility]  ?PHQ-2 Total Score 0  ? ?  ? ?Patient is on a Regular diet. Marital status: Single. Relevant history for alcohol use is:  ?Social History  ? ?Substance and Sexual Activity  ?Alcohol Use No  ? ?Relevant history for tobacco use is:  ?Social History  ? ?Tobacco Use  ?Smoking Status Never  ?Smokeless Tobacco Never  ?.  ? ?Review of Systems  ?Constitutional:  Negative.   ?HENT: Negative.    ?Eyes: Negative.   ?Respiratory: Negative.    ?Cardiovascular: Negative.  Negative for chest pain and palpitations.  ?Gastrointestinal: Negative.   ?Endocrine: Negative.   ?Genitourinary: Negative.   ?Musculoskeletal: Negative.   ?Skin: Negative.   ?Allergic/Immunologic: Negative.   ?Neurological: Negative.   ?Hematological: Negative.   ?Psychiatric/Behavioral: Negative.     ? ?Today's Vitals  ? 11/21/21 1029  ?BP: 130/78  ?Pulse: 78  ?Temp: 98.1 ?F (36.7 ?C)  ?TempSrc: Oral  ?Weight: 191 lb (86.6 kg)  ?Height: 5' 9"  (1.753 m)  ? ?Body mass index is 28.21 kg/m?.  ?Wt Readings from Last 3 Encounters:  ?11/21/21 191 lb (86.6 kg)  ?05/28/21 191 lb 3.2 oz (86.7 kg)  ?11/15/20 190 lb (86.2 kg)  ? ? ?Objective:  ?Physical Exam ?Vitals reviewed.  ?Constitutional:   ?   General: He is not in acute distress. ?   Appearance: Normal appearance.  ?HENT:  ?   Head: Normocephalic.  ?   Right Ear: Tympanic membrane, ear canal and external ear normal. There is no impacted cerumen.  ?   Left Ear: Tympanic membrane, ear canal and external ear normal. There is no impacted cerumen.  ?   Nose:  ?   Comments: Deferred - mask ?   Mouth/Throat:  ?   Comments: Deferred - mask ?Eyes:  ?   Extraocular Movements: Extraocular movements intact.  ?   Conjunctiva/sclera: Conjunctivae normal.  ?   Pupils: Pupils are equal, round, and reactive to light.  ?Cardiovascular:  ?   Rate and Rhythm: Normal rate and regular rhythm.  ?   Pulses: Normal pulses.  ?   Heart sounds: Normal heart sounds. No murmur heard. ?Pulmonary:  ?   Effort: Pulmonary effort is normal. No respiratory distress.  ?   Breath sounds: Normal breath sounds. No wheezing.  ?Abdominal:  ?   General: Abdomen is flat. Bowel sounds are normal. There is no distension.  ?   Palpations: Abdomen is soft. There is no mass.  ?   Tenderness: There is no abdominal tenderness. There is no guarding.  ?Genitourinary: ?   Comments: Deferred - mother declined, will  check PSA level ?Musculoskeletal:     ?   General: Normal range of motion.  ?   Cervical back: Normal range of motion and neck supple. No rigidity.  ?Skin: ?   General: Skin is warm and dry.  ?   Capillary Refill: Capillary refill takes less than 2 seconds.  ?Neurological:  ?   General: No focal deficit present.  ?   Mental Status: He is alert and oriented to person, place, and time.  ?   Cranial Nerves: No cranial nerve deficit.  ?   Motor: No weakness.  ?Psychiatric:     ?   Mood and Affect: Mood normal.     ?  Behavior: Behavior normal.     ?   Thought Content: Thought content normal.     ?   Judgment: Judgment normal.  ?  ? ?   ?Assessment And Plan:  ?  ?1. Health maintenance examination ?Behavior modifications discussed and diet history reviewed.   ?Pt will continue to exercise regularly and modify diet with low GI, plant based foods and decrease intake of processed foods.  ?Recommend intake of daily multivitamin, Vitamin D, and calcium.  ?Recommend mammogram and colonoscopy for preventive screenings, as well as recommend immunizations that include influenza, TDAP (up to date) advised he can get his covid vaccine at any time ? ?2. Essential hypertension ?Comments: Blood pressure is controlled, continue current medications. EKG done with NSR HR 62 ?- POCT Urinalysis Dipstick (81002) ?- EKG 12-Lead ?- Microalbumin / Creatinine Urine Ratio ?- trandolapril (MAVIK) 2 MG tablet; Take 1 tablet (2 mg total) by mouth daily.  Dispense: 90 tablet; Refill: 1 ? ?3. Mixed hyperlipidemia ?Comments: Well controlled, diet controlled. ?- CMP14+EGFR ?- Lipid panel ?- atorvastatin (LIPITOR) 10 MG tablet; TAKE 1 TABLET(10 MG) BY MOUTH DAILY  Dispense: 90 tablet; Refill: 1 ? ?4. Vitamin D deficiency ?Will check vitamin D level and supplement as needed.    ?Also encouraged to spend 15 minutes in the sun daily.  ?- VITAMIN D 25 Hydroxy (Vit-D Deficiency, Fractures) ? ?5. Bipolar II disorder ([place]) ?Comments: Continue follow up with  Behavioral Health ? ?6. Autistic disorder ?Comments: Continue follow up with Behavioral Health ? ?7. Screening for HIV without presence of risk factors ?- HIV Antibody (routine testing w rflx) ? ?8. Encounter for prostat

## 2021-11-22 LAB — LIPID PANEL
Chol/HDL Ratio: 2.4 ratio (ref 0.0–5.0)
Cholesterol, Total: 152 mg/dL (ref 100–199)
HDL: 63 mg/dL (ref >39)
LDL Chol Calc (NIH): 77 mg/dL (ref 0–99)
Triglycerides: 58 mg/dL (ref 0–149)
VLDL Cholesterol Cal: 12 mg/dL (ref 5–40)

## 2021-11-22 LAB — CMP14+EGFR
ALT: 28 IU/L (ref 0–44)
AST: 26 IU/L (ref 0–40)
Albumin/Globulin Ratio: 1.7 (ref 1.2–2.2)
Albumin: 4.6 g/dL (ref 4.0–5.0)
Alkaline Phosphatase: 61 IU/L (ref 44–121)
BUN/Creatinine Ratio: 10 (ref 9–20)
BUN: 10 mg/dL (ref 6–24)
Bilirubin Total: 0.6 mg/dL (ref 0.0–1.2)
CO2: 25 mmol/L (ref 20–29)
Calcium: 9.7 mg/dL (ref 8.7–10.2)
Chloride: 106 mmol/L (ref 96–106)
Creatinine, Ser: 0.97 mg/dL (ref 0.76–1.27)
Globulin, Total: 2.7 g/dL (ref 1.5–4.5)
Glucose: 65 mg/dL — ABNORMAL LOW (ref 70–99)
Potassium: 5.2 mmol/L (ref 3.5–5.2)
Sodium: 142 mmol/L (ref 134–144)
Total Protein: 7.3 g/dL (ref 6.0–8.5)
eGFR: 101 mL/min/{1.73_m2} (ref >59)

## 2021-11-22 LAB — MICROALBUMIN / CREATININE URINE RATIO
Creatinine, Urine: 28.8 mg/dL
Microalb/Creat Ratio: <10 mg/g creat (ref 0–29)
Microalbumin, Urine: <3 ug/mL

## 2021-11-22 LAB — CBC
Hematocrit: 42.2 % (ref 37.5–51.0)
Hemoglobin: 14.4 g/dL (ref 13.0–17.7)
MCH: 29.2 pg (ref 26.6–33.0)
MCHC: 34.1 g/dL (ref 31.5–35.7)
MCV: 86 fL (ref 79–97)
Platelets: 177 10*3/uL (ref 150–450)
RBC: 4.93 x10E6/uL (ref 4.14–5.80)
RDW: 12.8 % (ref 11.6–15.4)
WBC: 3.4 10*3/uL (ref 3.4–10.8)

## 2021-11-22 LAB — VITAMIN D 25 HYDROXY (VIT D DEFICIENCY, FRACTURES): Vit D, 25-Hydroxy: 30.8 ng/mL (ref 30.0–100.0)

## 2021-11-22 LAB — HIV ANTIBODY (ROUTINE TESTING W REFLEX): HIV Screen 4th Generation wRfx: NONREACTIVE

## 2021-11-22 LAB — PSA: Prostate Specific Ag, Serum: 0.5 ng/mL (ref 0.0–4.0)

## 2022-03-24 ENCOUNTER — Encounter: Payer: Self-pay | Admitting: Nurse Practitioner

## 2022-03-24 ENCOUNTER — Ambulatory Visit (INDEPENDENT_AMBULATORY_CARE_PROVIDER_SITE_OTHER): Payer: Medicaid Other | Admitting: Nurse Practitioner

## 2022-03-24 VITALS — BP 112/78 | HR 87 | Temp 98.3°F | Ht 69.0 in | Wt 190.0 lb

## 2022-03-24 DIAGNOSIS — F3181 Bipolar II disorder: Secondary | ICD-10-CM

## 2022-03-24 DIAGNOSIS — E782 Mixed hyperlipidemia: Secondary | ICD-10-CM

## 2022-03-24 DIAGNOSIS — F840 Autistic disorder: Secondary | ICD-10-CM | POA: Diagnosis not present

## 2022-03-24 DIAGNOSIS — I10 Essential (primary) hypertension: Secondary | ICD-10-CM | POA: Diagnosis not present

## 2022-03-24 NOTE — Progress Notes (Signed)
I,Tianna Badgett,acting as a Education administrator for [employer], FNP.,have documented all relevant documentation on the behalf of [name], FNP,as directed by  [name], FNP while in the presence of [name], [place].  Subjective:     Patient ID: Matthew Gross , male    DOB: [birth year] , 41 y.o.   MRN: [id number]   Chief Complaint  Patient presents with   Hypertension    HPI  Pt here for HTN and cholesterol f/u. He is here today with his mother, no current concerns. Continues to go to [employer] no changes   Hypertension This is a chronic problem. The current episode started more than 1 year ago. The problem is unchanged. The problem is controlled. Pertinent negatives include no anxiety, chest pain or palpitations. There are no associated agents to hypertension. Risk factors for coronary artery disease include sedentary lifestyle. Past treatments include ACE inhibitors. The current treatment provides significant improvement. There are no compliance problems.  There is no history of angina or kidney disease. There is no history of chronic renal disease.     Past Medical History:  Diagnosis Date   Autistic disorder PT GOES TO A DAY PROGRAM DAILY AND HAS ASSISTANCE AT HOME IN THE EVERY   PT MOTHER LEGAL GAUDERIAN   Colon polyps    Depressed bipolar II disorder ([place])    Hyperlipidemia    Hypertension    Penile cyst    Rectal bleeding 2017   before and colonscopy     Family History  Problem Relation Age of Onset   Heart failure Mother    Hypertension Mother    Hyperlipidemia Mother      Current Outpatient Medications:    atorvastatin (LIPITOR) 10 MG tablet, TAKE 1 TABLET(10 MG) BY MOUTH DAILY, Disp: 90 tablet, Rfl: 1   buPROPion (WELLBUTRIN XL) 300 MG 24 hr tablet, Take 300 mg by mouth daily. , Disp: , Rfl:    gabapentin (NEURONTIN) 400 MG capsule, Take 800 mg by mouth at bedtime. , Disp: , Rfl: 0   Loratadine (CLARITIN PO), Take 1 capsule by mouth daily., Disp: , Rfl:     risperiDONE (RISPERDAL) 1 MG tablet, Take 1 mg by mouth every other day. , Disp: , Rfl: 0   sertraline (ZOLOFT) 100 MG tablet, Take 150 mg by mouth daily., Disp: , Rfl: 0   trandolapril (MAVIK) 2 MG tablet, Take 1 tablet (2 mg total) by mouth daily., Disp: 90 tablet, Rfl: 1   Allergies  Allergen Reactions   Penicillins Hives    Has patient had a PCN reaction causing immediate rash, facial/tongue/throat swelling, SOB or lightheadedness with hypotension: Yes Has patient had a PCN reaction causing severe rash involving mucus membranes or skin necrosis: No Has patient had a PCN reaction that required hospitalization No Has patient had a PCN reaction occurring within the last 10 years: Yes If all of the above answers are "NO", then may proceed with Cephalosporin use.   Robaxin [Methocarbamol] Rash     Review of Systems  Constitutional: Negative.   Respiratory: Negative.    Cardiovascular: Negative.  Negative for chest pain and palpitations.  Gastrointestinal: Negative.   Neurological: Negative.   Psychiatric/Behavioral: Negative.       Today's Vitals   03/24/22 0938  BP: 112/78  Pulse: 87  Temp: 98.3 F (36.8 C)  TempSrc: Oral  Weight: 190 lb (86.2 kg)  Height: '5\' 9"'$  (1.753 m)   Body mass index is 28.06 kg/m.  Wt Readings from  Last 3 Encounters:  03/24/22 190 lb (86.2 kg)  11/21/21 191 lb (86.6 kg)  05/28/21 191 lb 3.2 oz (86.7 kg)    Objective:  Physical Exam Vitals reviewed.  Constitutional:      General: He is not in acute distress.    Appearance: Normal appearance.  Cardiovascular:     Rate and Rhythm: Normal rate and regular rhythm.     Pulses: Normal pulses.     Heart sounds: Normal heart sounds.  Pulmonary:     Effort: Pulmonary effort is normal. No respiratory distress.     Breath sounds: Normal breath sounds. No wheezing.  Skin:    General: Skin is warm and dry.     Capillary Refill: Capillary refill takes less than 2 seconds.  Neurological:     General:  No focal deficit present.     Mental Status: He is alert and oriented to person, place, and time.     Cranial Nerves: No cranial nerve deficit.     Motor: No weakness.  Psychiatric:        Mood and Affect: Mood normal.        Behavior: Behavior normal.        Thought Content: Thought content normal.        Judgment: Judgment normal.     Comments: His mother answers for him         Assessment And Plan:     1. Essential hypertension Comments: Blood pressure is well controlled, continue current medications.  2. Mixed hyperlipidemia Comments: Controlled, continue current medications, tolerating statin well.   3. Autistic disorder Comments: Continue follow up with Behavioral Health, no changes.  4. Bipolar II disorder ([place]) Comments: Continue follow up with Behavioral Health, no changes.    Patient was given opportunity to ask questions. Patient verbalized understanding of the plan and was able to repeat key elements of the plan. All questions were answered to their satisfaction.  [name], FNP   I, [name], FNP, have reviewed all documentation for this visit. The documentation on 03/24/22 for the exam, diagnosis, procedures, and orders are all accurate and complete.   IF YOU HAVE BEEN REFERRED TO A SPECIALIST, IT MAY TAKE 1-2 WEEKS TO SCHEDULE/PROCESS THE REFERRAL. IF YOU HAVE NOT HEARD FROM US/SPECIALIST IN TWO WEEKS, PLEASE GIVE [place] A CALL AT [phone].   THE PATIENT IS ENCOURAGED TO PRACTICE SOCIAL DISTANCING DUE TO THE COVID-19 PANDEMIC.

## 2022-03-24 NOTE — Patient Instructions (Signed)
Hypertension, Adult High blood pressure (hypertension) is when the force of blood pumping through the arteries is too strong. The arteries are the blood vessels that carry blood from the heart throughout the body. Hypertension forces the heart to work harder to pump blood and may cause arteries to become narrow or stiff. Untreated or uncontrolled hypertension can lead to a heart attack, heart failure, a stroke, kidney disease, and other problems. A blood pressure reading consists of a higher number over a lower number. Ideally, your blood pressure should be below 120/80. The first ("top") number is called the systolic pressure. It is a measure of the pressure in your arteries as your heart beats. The second ("bottom") number is called the diastolic pressure. It is a measure of the pressure in your arteries as the heart relaxes. What are the causes? The exact cause of this condition is not known. There are some conditions that result in high blood pressure. What increases the risk? Certain factors may make you more likely to develop high blood pressure. Some of these risk factors are under your control, including: Smoking. Not getting enough exercise or physical activity. Being overweight. Having too much fat, sugar, calories, or salt (sodium) in your diet. Drinking too much alcohol. Other risk factors include: Having a personal history of heart disease, diabetes, high cholesterol, or kidney disease. Stress. Having a family history of high blood pressure and high cholesterol. Having obstructive sleep apnea. Age. The risk increases with age. What are the signs or symptoms? High blood pressure may not cause symptoms. Very high blood pressure (hypertensive crisis) may cause: Headache. Fast or irregular heartbeats (palpitations). Shortness of breath. Nosebleed. Nausea and vomiting. Vision changes. Severe chest pain, dizziness, and seizures. How is this diagnosed? This condition is diagnosed by  measuring your blood pressure while you are seated, with your arm resting on a flat surface, your legs uncrossed, and your feet flat on the floor. The cuff of the blood pressure monitor will be placed directly against the skin of your upper arm at the level of your heart. Blood pressure should be measured at least twice using the same arm. Certain conditions can cause a difference in blood pressure between your right and left arms. If you have a high blood pressure reading during one visit or you have normal blood pressure with other risk factors, you may be asked to: Return on a different day to have your blood pressure checked again. Monitor your blood pressure at home for 1 week or longer. If you are diagnosed with hypertension, you may have other blood or imaging tests to help your health care provider understand your overall risk for other conditions. How is this treated? This condition is treated by making healthy lifestyle changes, such as eating healthy foods, exercising more, and reducing your alcohol intake. You may be referred for counseling on a healthy diet and physical activity. Your health care provider may prescribe medicine if lifestyle changes are not enough to get your blood pressure under control and if: Your systolic blood pressure is above 130. Your diastolic blood pressure is above 80. Your personal target blood pressure may vary depending on your medical conditions, your age, and other factors. Follow these instructions at home: Eating and drinking  Eat a diet that is high in fiber and potassium, and low in sodium, added sugar, and fat. An example of this eating plan is called the DASH diet. DASH stands for Dietary Approaches to Stop Hypertension. To eat this way: Eat   plenty of fresh fruits and vegetables. Try to fill one half of your plate at each meal with fruits and vegetables. Eat whole grains, such as whole-wheat pasta, brown rice, or whole-grain bread. Fill about one  fourth of your plate with whole grains. Eat or drink low-fat dairy products, such as skim milk or low-fat yogurt. Avoid fatty cuts of meat, processed or cured meats, and poultry with skin. Fill about one fourth of your plate with lean proteins, such as fish, chicken without skin, beans, eggs, or tofu. Avoid pre-made and processed foods. These tend to be higher in sodium, added sugar, and fat. Reduce your daily sodium intake. Many people with hypertension should eat less than 1,500 mg of sodium a day. Do not drink alcohol if: Your health care provider tells you not to drink. You are pregnant, may be pregnant, or are planning to become pregnant. If you drink alcohol: Limit how much you have to: 0-1 drink a day for women. 0-2 drinks a day for men. Know how much alcohol is in your drink. In the [place] one drink equals one 12 oz bottle of beer (355 mL), one 5 oz glass of wine (148 mL), or one 1 oz glass of hard liquor (44 mL). Lifestyle  Work with your health care provider to maintain a healthy body weight or to lose weight. Ask what an ideal weight is for you. Get at least 30 minutes of exercise that causes your heart to beat faster (aerobic exercise) most days of the week. Activities may include walking, swimming, or biking. Include exercise to strengthen your muscles (resistance exercise), such as Pilates or lifting weights, as part of your weekly exercise routine. Try to do these types of exercises for 30 minutes at least 3 days a week. Do not use any products that contain nicotine or tobacco. These products include cigarettes, chewing tobacco, and vaping devices, such as e-cigarettes. If you need help quitting, ask your health care provider. Monitor your blood pressure at home as told by your health care provider. Keep all follow-up visits. This is important. Medicines Take over-the-counter and prescription medicines only as told by your health care provider. Follow directions carefully. Blood  pressure medicines must be taken as prescribed. Do not skip doses of blood pressure medicine. Doing this puts you at risk for problems and can make the medicine less effective. Ask your health care provider about side effects or reactions to medicines that you should watch for. Contact a health care provider if you: Think you are having a reaction to a medicine you are taking. Have headaches that keep coming back (recurring). Feel dizzy. Have swelling in your ankles. Have trouble with your vision. Get help right away if you: Develop a severe headache or confusion. Have unusual weakness or numbness. Feel faint. Have severe pain in your chest or abdomen. Vomit repeatedly. Have trouble breathing. These symptoms may be an emergency. Get help right away. Call 911. Do not wait to see if the symptoms will go away. Do not drive yourself to the hospital. Summary Hypertension is when the force of blood pumping through your arteries is too strong. If this condition is not controlled, it may put you at risk for serious complications. Your personal target blood pressure may vary depending on your medical conditions, your age, and other factors. For most people, a normal blood pressure is less than 120/80. Hypertension is treated with lifestyle changes, medicines, or a combination of both. Lifestyle changes include losing weight, eating a healthy,   low-sodium diet, exercising more, and limiting alcohol. This information is not intended to replace advice given to you by your health care provider. Make sure you discuss any questions you have with your health care provider. Document Revised: 05/28/2021 Document Reviewed: 05/28/2021 Elsevier Patient Education  2023 Elsevier Inc.  

## 2022-04-15 ENCOUNTER — Ambulatory Visit (INDEPENDENT_AMBULATORY_CARE_PROVIDER_SITE_OTHER): Payer: Medicaid Other

## 2022-04-15 VITALS — BP 120/62 | HR 76 | Temp 98.3°F | Ht 69.0 in | Wt 190.0 lb

## 2022-04-15 DIAGNOSIS — Z23 Encounter for immunization: Secondary | ICD-10-CM | POA: Diagnosis not present

## 2022-04-15 NOTE — Progress Notes (Signed)
Patient presents today for a flu shot. YL,RMA 

## 2022-05-02 ENCOUNTER — Telehealth: Payer: Self-pay

## 2022-05-02 NOTE — Telephone Encounter (Signed)
Pa completed for trandolapril

## 2022-07-30 ENCOUNTER — Emergency Department (HOSPITAL_BASED_OUTPATIENT_CLINIC_OR_DEPARTMENT_OTHER)
Admission: EM | Admit: 2022-07-30 | Discharge: 2022-07-30 | Disposition: A | Discharge status: Home / Self Care | Payer: Medicaid Other | Attending: Emergency Medicine | Admitting: Emergency Medicine

## 2022-07-30 ENCOUNTER — Other Ambulatory Visit: Payer: Self-pay

## 2022-07-30 DIAGNOSIS — Z79899 Other long term (current) drug therapy: Secondary | ICD-10-CM | POA: Diagnosis not present

## 2022-07-30 DIAGNOSIS — R21 Rash and other nonspecific skin eruption: Secondary | ICD-10-CM | POA: Diagnosis present

## 2022-07-30 DIAGNOSIS — L404 Guttate psoriasis: Secondary | ICD-10-CM | POA: Diagnosis not present

## 2022-07-30 DIAGNOSIS — I10 Essential (primary) hypertension: Secondary | ICD-10-CM | POA: Insufficient documentation

## 2022-07-30 MED ORDER — TRIAMCINOLONE ACETONIDE 0.1 % EX CREA: 1.0000 | TOPICAL_CREAM | Freq: Two times a day (BID) | CUTANEOUS | 0 refills | Status: AC

## 2022-07-30 NOTE — ED Triage Notes (Signed)
Patient presents to ED via POV from home with legal guardian. Here with rash and fevers at home. Denies itching.

## 2022-07-30 NOTE — ED Provider Notes (Signed)
[facility] Provider Note   CSN: [id number] Arrival date & time: 07/30/22  1116     History  Chief Complaint  Patient presents with   Rash    Matthew Gross is a 41 y.o. male with PMH significant for previous history of psoriasis, MR, bipolar, HTN who presents with concern for multiple itchy, red lesions on back, abdomen..  They have been using lotion at home, no topical steroids.  Mother reports previous history of topical steroid use which was helpful.  No recent new drug use.   Rash      Home Medications Prior to Admission medications   Medication Sig Start Date End Date Taking? Authorizing Provider  triamcinolone cream (KENALOG) 0.1 % Apply 1 Application topically 2 (two) times daily. Limit application in any 1 area to 2 weeks at a time before taking a break, do not use in any skin folds, genitals, or on the face 07/30/22  Yes Chaim Gatley H, PA-C  atorvastatin (LIPITOR) 10 MG tablet TAKE 1 TABLET(10 MG) BY MOUTH DAILY 11/21/21   [name], FNP  buPROPion (WELLBUTRIN XL) 300 MG 24 hr tablet Take 300 mg by mouth daily.     [provider]  gabapentin (NEURONTIN) 400 MG capsule Take 800 mg by mouth at bedtime.  12/04/15   [provider]  Loratadine (CLARITIN PO) Take 1 capsule by mouth daily.    [provider]  risperiDONE (RISPERDAL) 1 MG tablet Take 1 mg by mouth every other day.  12/04/15   [provider]  sertraline (ZOLOFT) 100 MG tablet Take 150 mg by mouth daily. 12/04/15   [provider]  trandolapril (MAVIK) 2 MG tablet Take 1 tablet (2 mg total) by mouth daily. 11/21/21   [name], FNP      Allergies    Penicillins and Robaxin [methocarbamol]    Review of Systems   Review of Systems  Skin:  Positive for rash.  All other systems reviewed and are negative.   Physical Exam Updated Vital Signs BP (!) 126/90   Pulse 66   Temp 98.1 F (36.7 C) (Oral)   Resp 17   SpO2 100%   Physical Exam Vitals and nursing note reviewed.  Constitutional:      General: He is not in acute distress.    Appearance: Normal appearance.  HENT:     Head: Normocephalic and atraumatic.  Eyes:     General:        Right eye: No discharge.        Left eye: No discharge.  Cardiovascular:     Rate and Rhythm: Normal rate and regular rhythm.  Pulmonary:     Effort: Pulmonary effort is normal. No respiratory distress.  Musculoskeletal:        General: No deformity.  Skin:    General: Skin is warm and dry.     Comments: Scattered thick papules with some overlying excoriation, no evidence of overlying redness, radiating erythema, honey colored discharge, or fluctuance  Neurological:     Mental Status: He is alert and oriented to person, place, and time.  Psychiatric:        Mood and Affect: Mood normal.        Behavior: Behavior normal.     ED Results / Procedures / Treatments   Labs (all labs ordered are listed, but only abnormal results are displayed) Labs Reviewed - No data to display  EKG None  Radiology No results found.  Procedures Procedures    Medications Ordered in ED Medications - No data to display  ED Course/ Medical Decision Making/ A&P                           Medical Decision Making  This is a well-appearing 41 year old male history of MR, psoriasis who presents with concern for rash over the last 2 days, with history of psoriasis.  On clinical exam patient's rash is consistent with psoriasis versus eczema.  He is not using any topical steroids at this time.  Encouraged topical triamcinolone use in addition to lotion, avoiding hot showers.  Encouraged avoiding skin folds, genitals, face.  Encourage follow-up with dermatology as needed.  Patient discharged in stable condition at this time, return precautions given. Final Clinical Impression(s) / ED Diagnoses Final diagnoses:  Guttate psoriasis    Rx / DC Orders ED Discharge Orders           Ordered    triamcinolone cream (KENALOG) 0.1 %  2 times daily        07/30/22 1359              Ellis Mehaffey, Joesph Fillers, PA-C 07/30/22 1405    Davonna Belling, MD 07/31/22 [id number]

## 2022-08-07 ENCOUNTER — Telehealth: Payer: Self-pay

## 2022-08-07 NOTE — Telephone Encounter (Signed)
Transition Care Management Unsuccessful Follow-up Telephone Call  Date of discharge and from where:  07/30/2022 [facility]   Attempts:  1st Attempt  Reason for unsuccessful TCM follow-up call:  Left voice message

## 2022-08-12 NOTE — Telephone Encounter (Signed)
Chmg-error.  

## 2022-08-13 ENCOUNTER — Ambulatory Visit (INDEPENDENT_AMBULATORY_CARE_PROVIDER_SITE_OTHER): Payer: Medicaid Other | Admitting: Nurse Practitioner

## 2022-08-13 ENCOUNTER — Encounter: Payer: Self-pay | Admitting: Nurse Practitioner

## 2022-08-13 VITALS — BP 134/70 | HR 82 | Temp 98.3°F | Ht 69.0 in | Wt 190.0 lb

## 2022-08-13 DIAGNOSIS — I10 Essential (primary) hypertension: Secondary | ICD-10-CM

## 2022-08-13 DIAGNOSIS — E782 Mixed hyperlipidemia: Secondary | ICD-10-CM | POA: Diagnosis not present

## 2022-08-13 DIAGNOSIS — R21 Rash and other nonspecific skin eruption: Secondary | ICD-10-CM | POA: Diagnosis not present

## 2022-08-13 DIAGNOSIS — F840 Autistic disorder: Secondary | ICD-10-CM

## 2022-08-13 DIAGNOSIS — F3181 Bipolar II disorder: Secondary | ICD-10-CM

## 2022-08-13 LAB — CBC
Hematocrit: 42.4 % (ref 37.5–51.0)
Hemoglobin: 14.5 g/dL (ref 13.0–17.7)
MCH: 28.8 pg (ref 26.6–33.0)
MCHC: 34.2 g/dL (ref 31.5–35.7)
MCV: 84 fL (ref 79–97)
Platelets: 202 10*3/uL (ref 150–450)
RBC: 5.04 x10E6/uL (ref 4.14–5.80)
RDW: 12.5 % (ref 11.6–15.4)
WBC: 3.4 10*3/uL (ref 3.4–10.8)

## 2022-08-13 LAB — CMP14+EGFR
ALT: 22 IU/L (ref 0–44)
AST: 22 IU/L (ref 0–40)
Albumin/Globulin Ratio: 1.8 (ref 1.2–2.2)
Albumin: 4.8 g/dL (ref 4.1–5.1)
Alkaline Phosphatase: 66 IU/L (ref 44–121)
BUN/Creatinine Ratio: 9 (ref 9–20)
BUN: 9 mg/dL (ref 6–24)
Bilirubin Total: 0.6 mg/dL (ref 0.0–1.2)
CO2: 24 mmol/L (ref 20–29)
Calcium: 9.8 mg/dL (ref 8.7–10.2)
Chloride: 104 mmol/L (ref 96–106)
Creatinine, Ser: 1.01 mg/dL (ref 0.76–1.27)
Globulin, Total: 2.7 g/dL (ref 1.5–4.5)
Glucose: 68 mg/dL — ABNORMAL LOW (ref 70–99)
Potassium: 4.6 mmol/L (ref 3.5–5.2)
Sodium: 140 mmol/L (ref 134–144)
Total Protein: 7.5 g/dL (ref 6.0–8.5)
eGFR: 96 mL/min/{1.73_m2} (ref >59)

## 2022-08-13 MED ORDER — ATORVASTATIN CALCIUM 10 MG PO TABS: ORAL_TABLET | ORAL | 1 refills | Status: DC

## 2022-08-13 MED ORDER — PREDNISONE 10 MG (21) PO TBPK: ORAL_TABLET | ORAL | 0 refills | Status: DC

## 2022-08-13 MED ORDER — TRANDOLAPRIL 2 MG PO TABS: 2.0000 mg | ORAL_TABLET | Freq: Every day | ORAL | 1 refills | Status: DC

## 2022-08-13 NOTE — Progress Notes (Signed)
Subjective:     Patient ID: Matthew Gross , male    DOB: 1981/05/23 , 42 y.o.   MRN: [id number]   Chief Complaint  Patient presents with   Hypertension   Hyperlipidemia    HPI  Pt here for HTN and cholesterol f/u. He is here today with his mother, no current concerns. Continues to go to [employer] no changes. On December 26th he had a psoriasis break out and has not had a break out in 20 years. She has been using the steroid cream daily. Unsure if itching. His mother reports the areas on his abdomen are better. He has new areas to his legs at near the creases of his legs, chest area.   Hypertension This is a chronic problem. The current episode started more than 1 year ago. The problem is unchanged. The problem is controlled. Pertinent negatives include no anxiety, chest pain or palpitations. There are no associated agents to hypertension. Risk factors for coronary artery disease include sedentary lifestyle. Past treatments include ACE inhibitors. The current treatment provides significant improvement. There are no compliance problems.  There is no history of angina or kidney disease. There is no history of chronic renal disease.  Hyperlipidemia He has no history of chronic renal disease. Pertinent negatives include no chest pain.     Past Medical History:  Diagnosis Date   Autistic disorder PT GOES TO A DAY PROGRAM DAILY AND HAS ASSISTANCE AT HOME IN THE EVERY   PT MOTHER LEGAL GAUDERIAN   Colon polyps    Depressed bipolar II disorder ([place])    Hyperlipidemia    Hypertension    Penile cyst    Rectal bleeding 2017   before and colonscopy     Family History  Problem Relation Age of Onset   Heart failure Mother    Hypertension Mother    Hyperlipidemia Mother      Current Outpatient Medications:    predniSONE (STERAPRED UNI-PAK 21 TAB) 10 MG (21) TBPK tablet, Take as directed, Disp: 21 tablet, Rfl: 0   atorvastatin (LIPITOR) 10 MG tablet, TAKE 1 TABLET(10 MG) BY  MOUTH DAILY, Disp: 90 tablet, Rfl: 1   buPROPion (WELLBUTRIN XL) 300 MG 24 hr tablet, Take 300 mg by mouth daily. , Disp: , Rfl:    gabapentin (NEURONTIN) 400 MG capsule, Take 800 mg by mouth at bedtime. , Disp: , Rfl: 0   Loratadine (CLARITIN PO), Take 1 capsule by mouth daily., Disp: , Rfl:    risperiDONE (RISPERDAL) 1 MG tablet, Take 1 mg by mouth every other day. , Disp: , Rfl: 0   sertraline (ZOLOFT) 100 MG tablet, Take 150 mg by mouth daily., Disp: , Rfl: 0   trandolapril (MAVIK) 2 MG tablet, Take 1 tablet (2 mg total) by mouth daily., Disp: 90 tablet, Rfl: 1   triamcinolone cream (KENALOG) 0.1 %, Apply 1 Application topically 2 (two) times daily. Limit application in any 1 area to 2 weeks at a time before taking a break, do not use in any skin folds, genitals, or on the face, Disp: 80 g, Rfl: 0   Allergies  Allergen Reactions   Penicillins Hives    Has patient had a PCN reaction causing immediate rash, facial/tongue/throat swelling, SOB or lightheadedness with hypotension: Yes Has patient had a PCN reaction causing severe rash involving mucus membranes or skin necrosis: No Has patient had a PCN reaction that required hospitalization No Has patient had a PCN reaction occurring within the  last 10 years: Yes If all of the above answers are "NO", then may proceed with Cephalosporin use.   Robaxin [Methocarbamol] Rash     Review of Systems  Constitutional: Negative.   Respiratory: Negative.    Cardiovascular:  Negative for chest pain, palpitations and leg swelling.  Gastrointestinal: Negative.   Skin:  Positive for rash.  Allergic/Immunologic: Negative.   Neurological: Negative.   Hematological: Negative.   Psychiatric/Behavioral: Negative.       Today's Vitals   08/13/22 0948  BP: 134/70  Pulse: 82  Temp: 98.3 F (36.8 C)  TempSrc: Oral  Weight: 190 lb (86.2 kg)  Height: '5\' 9"'$  (1.753 m)   Body mass index is 28.06 kg/m.   Objective:  Physical Exam Vitals reviewed.   Constitutional:      General: He is not in acute distress.    Appearance: Normal appearance.  Cardiovascular:     Rate and Rhythm: Normal rate and regular rhythm.     Pulses: Normal pulses.     Heart sounds: Normal heart sounds. No murmur heard. Pulmonary:     Effort: Pulmonary effort is normal. No respiratory distress.     Breath sounds: Normal breath sounds. No wheezing.  Skin:    General: Skin is warm and dry.     Findings: Rash (scattered dry, scaly rash to bilateral groin.  Also has to chest and abdomen with some slight erythema) present.  Neurological:     Mental Status: He is alert.         Assessment And Plan:     1. Essential hypertension Comments: Blood pressure is controlled, continue current medications - trandolapril (MAVIK) 2 MG tablet; Take 1 tablet (2 mg total) by mouth daily.  Dispense: 90 tablet; Refill: 1  2. Mixed hyperlipidemia Comments: Cholesterol levels are stable, continue current medications.  Tolerating well - CMP14+EGFR - atorvastatin (LIPITOR) 10 MG tablet; TAKE 1 TABLET(10 MG) BY MOUTH DAILY  Dispense: 90 tablet; Refill: 1 - Lipid panel  3. Rash and nonspecific skin eruption Comments: Scattered, scaly rash to groin bilaterally, chest and abdomen. Some relief with steroid cream however continues to spread. Will tx with steroid taper if not better will refer to Dermatology - predniSONE (STERAPRED UNI-PAK 21 TAB) 10 MG (21) TBPK tablet; Take as directed  Dispense: 21 tablet; Refill: 0  4. Autistic disorder Comments: Continue follow-up with behavioral health.  Will send copy of lab results to [facility]  5. Bipolar II disorder ([place]) - CMP14+EGFR - CBC     Patient was given opportunity to ask questions. Patient verbalized understanding of the plan and was able to repeat key elements of the plan. All questions were answered to their satisfaction.  [name], FNP   I, [name], FNP, have reviewed all documentation for this visit. The  documentation on 08/13/22 for the exam, diagnosis, procedures, and orders are all accurate and complete.   IF YOU HAVE BEEN REFERRED TO A SPECIALIST, IT MAY TAKE 1-2 WEEKS TO SCHEDULE/PROCESS THE REFERRAL. IF YOU HAVE NOT HEARD FROM US/SPECIALIST IN TWO WEEKS, PLEASE GIVE [place] A CALL AT [phone] X [phone].   THE PATIENT IS ENCOURAGED TO PRACTICE SOCIAL DISTANCING DUE TO THE COVID-19 PANDEMIC.

## 2022-08-13 NOTE — Patient Instructions (Signed)
Hypertension, Adult ?Hypertension is another name for high blood pressure. High blood pressure forces your heart to work harder to pump blood. This can cause problems over time. ?There are two numbers in a blood pressure reading. There is a top number (systolic) over a bottom number (diastolic). It is best to have a blood pressure that is below 120/80. ?What are the causes? ?The cause of this condition is not known. Some other conditions can lead to high blood pressure. ?What increases the risk? ?Some lifestyle factors can make you more likely to develop high blood pressure: ?Smoking. ?Not getting enough exercise or physical activity. ?Being overweight. ?Having too much fat, sugar, calories, or salt (sodium) in your diet. ?Drinking too much alcohol. ?Other risk factors include: ?Having any of these conditions: ?Heart disease. ?Diabetes. ?High cholesterol. ?Kidney disease. ?Obstructive sleep apnea. ?Having a family history of high blood pressure and high cholesterol. ?Age. The risk increases with age. ?Stress. ?What are the signs or symptoms? ?High blood pressure may not cause symptoms. Very high blood pressure (hypertensive crisis) may cause: ?Headache. ?Fast or uneven heartbeats (palpitations). ?Shortness of breath. ?Nosebleed. ?Vomiting or feeling like you may vomit (nauseous). ?Changes in how you see. ?Very bad chest pain. ?Feeling dizzy. ?Seizures. ?How is this treated? ?This condition is treated by making healthy lifestyle changes, such as: ?Eating healthy foods. ?Exercising more. ?Drinking less alcohol. ?Your doctor may prescribe medicine if lifestyle changes do not help enough and if: ?Your top number is above 130. ?Your bottom number is above 80. ?Your personal target blood pressure may vary. ?Follow these instructions at home: ?Eating and drinking ? ?If told, follow the DASH eating plan. To follow this plan: ?Fill one half of your plate at each meal with fruits and vegetables. ?Fill one fourth of your plate  at each meal with whole grains. Whole grains include whole-wheat pasta, brown rice, and whole-grain bread. ?Eat or drink low-fat dairy products, such as skim milk or low-fat yogurt. ?Fill one fourth of your plate at each meal with low-fat (lean) proteins. Low-fat proteins include fish, chicken without skin, eggs, beans, and tofu. ?Avoid fatty meat, cured and processed meat, or chicken with skin. ?Avoid pre-made or processed food. ?Limit the amount of salt in your diet to less than 1,500 mg each day. ?Do not drink alcohol if: ?Your doctor tells you not to drink. ?You are pregnant, may be pregnant, or are planning to become pregnant. ?If you drink alcohol: ?Limit how much you have to: ?0-1 drink a day for women. ?0-2 drinks a day for men. ?Know how much alcohol is in your drink. In the [place] one drink equals one 12 oz bottle of beer (355 mL), one 5 oz glass of wine (148 mL), or one 1? oz glass of hard liquor (44 mL). ?Lifestyle ? ?Work with your doctor to stay at a healthy weight or to lose weight. Ask your doctor what the best weight is for you. ?Get at least 30 minutes of exercise that causes your heart to beat faster (aerobic exercise) most days of the week. This may include walking, swimming, or biking. ?Get at least 30 minutes of exercise that strengthens your muscles (resistance exercise) at least 3 days a week. This may include lifting weights or doing Pilates. ?Do not smoke or use any products that contain nicotine or tobacco. If you need help quitting, ask your doctor. ?Check your blood pressure at home as told by your doctor. ?Keep all follow-up visits. ?Medicines ?Take over-the-counter and prescription medicines   only as told by your doctor. Follow directions carefully. ?Do not skip doses of blood pressure medicine. The medicine does not work as well if you skip doses. Skipping doses also puts you at risk for problems. ?Ask your doctor about side effects or reactions to medicines that you should watch  for. ?Contact a doctor if: ?You think you are having a reaction to the medicine you are taking. ?You have headaches that keep coming back. ?You feel dizzy. ?You have swelling in your ankles. ?You have trouble with your vision. ?Get help right away if: ?You get a very bad headache. ?You start to feel mixed up (confused). ?You feel weak or numb. ?You feel faint. ?You have very bad pain in your: ?Chest. ?Belly (abdomen). ?You vomit more than once. ?You have trouble breathing. ?These symptoms may be an emergency. Get help right away. Call 911. ?Do not wait to see if the symptoms will go away. ?Do not drive yourself to the hospital. ?Summary ?Hypertension is another name for high blood pressure. ?High blood pressure forces your heart to work harder to pump blood. ?For most people, a normal blood pressure is less than 120/80. ?Making healthy choices can help lower blood pressure. If your blood pressure does not get lower with healthy choices, you may need to take medicine. ?This information is not intended to replace advice given to you by your health care provider. Make sure you discuss any questions you have with your health care provider. ?Document Revised: 05/09/2021 Document Reviewed: 05/09/2021 ?Elsevier Patient Education ? 2023 [employer] Inc[employer] ? ?

## 2022-08-14 LAB — LIPID PANEL
Chol/HDL Ratio: 2.5 ratio (ref 0.0–5.0)
Cholesterol, Total: 147 mg/dL (ref 100–199)
HDL: 60 mg/dL (ref >39)
LDL Chol Calc (NIH): 74 mg/dL (ref 0–99)
Triglycerides: 61 mg/dL (ref 0–149)
VLDL Cholesterol Cal: 13 mg/dL (ref 5–40)

## 2022-11-24 ENCOUNTER — Encounter: Payer: Self-pay | Admitting: Nurse Practitioner

## 2022-11-24 ENCOUNTER — Ambulatory Visit (INDEPENDENT_AMBULATORY_CARE_PROVIDER_SITE_OTHER): Payer: Medicaid Other | Admitting: Nurse Practitioner

## 2022-11-24 VITALS — BP 118/68 | HR 64 | Temp 98.1°F | Ht 69.0 in | Wt 185.0 lb

## 2022-11-24 DIAGNOSIS — E559 Vitamin D deficiency, unspecified: Secondary | ICD-10-CM

## 2022-11-24 DIAGNOSIS — Z0000 Encounter for general adult medical examination without abnormal findings: Secondary | ICD-10-CM

## 2022-11-24 DIAGNOSIS — F3181 Bipolar II disorder: Secondary | ICD-10-CM | POA: Diagnosis not present

## 2022-11-24 DIAGNOSIS — Z125 Encounter for screening for malignant neoplasm of prostate: Secondary | ICD-10-CM

## 2022-11-24 DIAGNOSIS — I10 Essential (primary) hypertension: Secondary | ICD-10-CM | POA: Diagnosis not present

## 2022-11-24 DIAGNOSIS — E782 Mixed hyperlipidemia: Secondary | ICD-10-CM

## 2022-11-24 MED ORDER — TRANDOLAPRIL 2 MG PO TABS: 2.0000 mg | ORAL_TABLET | Freq: Every day | ORAL | 1 refills | Status: DC

## 2022-11-24 MED ORDER — ATORVASTATIN CALCIUM 10 MG PO TABS: ORAL_TABLET | ORAL | 1 refills | Status: DC

## 2022-11-24 NOTE — Progress Notes (Signed)
I,Sheena H Holbrook,acting as a Neurosurgeon for Arnette Felts, FNP.,have documented all relevant documentation on the behalf of Arnette Felts, FNP,as directed by  Arnette Felts, FNP while in the presence of Arnette Felts, FNP.   Subjective:     Patient ID: Matthew Gross , male    DOB: 12-25-1980 , 42 y.o.   MRN: [id number]   Chief Complaint  Patient presents with   Annual Exam    HPI  Patient presents today for annual exam.  He has had his colonoscopy in Feb 2024 small polyp   Wt Readings from Last 3 Encounters: 11/24/22 : 185 lb (83.9 kg) 08/13/22 : 190 lb (86.2 kg) 04/15/22 : 190 lb (86.2 kg)       Past Medical History:  Diagnosis Date   Autistic disorder PT GOES TO A DAY PROGRAM DAILY AND HAS ASSISTANCE AT HOME IN THE EVERY   PT MOTHER LEGAL GAUDERIAN   Colon polyps    Depressed bipolar II disorder    Hyperlipidemia    Hypertension    Penile cyst    Rectal bleeding 2017   before and colonscopy     Family History  Problem Relation Age of Onset   Heart failure Mother    Hypertension Mother    Hyperlipidemia Mother      Current Outpatient Medications:    buPROPion (WELLBUTRIN XL) 300 MG 24 hr tablet, Take 300 mg by mouth daily. , Disp: , Rfl:    gabapentin (NEURONTIN) 400 MG capsule, Take 800 mg by mouth at bedtime. , Disp: , Rfl: 0   Loratadine (CLARITIN PO), Take 1 capsule by mouth daily., Disp: , Rfl:    risperiDONE (RISPERDAL) 1 MG tablet, Take 1 mg by mouth every other day. , Disp: , Rfl: 0   sertraline (ZOLOFT) 100 MG tablet, Take 150 mg by mouth daily., Disp: , Rfl: 0   triamcinolone cream (KENALOG) 0.1 %, Apply 1 Application topically 2 (two) times daily. Limit application in any 1 area to 2 weeks at a time before taking a break, do not use in any skin folds, genitals, or on the face, Disp: 80 g, Rfl: 0   atorvastatin (LIPITOR) 10 MG tablet, TAKE 1 TABLET(10 MG) BY MOUTH DAILY, Disp: 90 tablet, Rfl: 1   trandolapril (MAVIK) 2 MG tablet, Take 1 tablet (2 mg total)  by mouth daily., Disp: 90 tablet, Rfl: 1   Allergies  Allergen Reactions   Penicillins Hives    Has patient had a PCN reaction causing immediate rash, facial/tongue/throat swelling, SOB or lightheadedness with hypotension: Yes Has patient had a PCN reaction causing severe rash involving mucus membranes or skin necrosis: No Has patient had a PCN reaction that required hospitalization No Has patient had a PCN reaction occurring within the last 10 years: Yes If all of the above answers are "NO", then may proceed with Cephalosporin use.   Robaxin [Methocarbamol] Rash     Men's preventive visit. Patient Health Questionnaire (PHQ-2) is  Flowsheet Row Office Visit from 11/24/2022 in [facility]  PHQ-2 Total Score 0     Patient is on a regular diet. Exercise - daily per mother 2-3 times a day. Marital status: Single. Relevant history for alcohol use is:  Social History   Substance and Sexual Activity  Alcohol Use No  . Relevant history for tobacco use is:  Social History   Tobacco Use  Smoking Status Never  Smokeless Tobacco Never  .   Review of Systems  All other systems reviewed and are negative.    Today's Vitals   11/24/22 1052  BP: 118/68  Pulse: 64  Temp: 98.1 F (36.7 C)  TempSrc: Oral  SpO2: 98%  Weight: 185 lb (83.9 kg)  Height: 5\' 9"  (1.753 m)   Body mass index is 27.32 kg/m.   Objective:  Physical Exam Vitals reviewed.  Constitutional:      General: He is not in acute distress.    Appearance: Normal appearance.  HENT:     Head: Normocephalic.     Right Ear: Tympanic membrane, ear canal and external ear normal. There is no impacted cerumen.     Left Ear: Tympanic membrane, ear canal and external ear normal. There is no impacted cerumen.     Nose: Nose normal.     Mouth/Throat:     Mouth: Mucous membranes are moist.  Eyes:     Extraocular Movements: Extraocular movements intact.     Conjunctiva/sclera: Conjunctivae  normal.     Pupils: Pupils are equal, round, and reactive to light.  Cardiovascular:     Rate and Rhythm: Normal rate and regular rhythm.     Pulses: Normal pulses.     Heart sounds: Normal heart sounds. No murmur heard. Pulmonary:     Effort: Pulmonary effort is normal. No respiratory distress.     Breath sounds: Normal breath sounds. No wheezing.  Abdominal:     General: Abdomen is flat. Bowel sounds are normal. There is no distension.     Palpations: Abdomen is soft. There is no mass.     Tenderness: There is no abdominal tenderness. There is no guarding.  Genitourinary:    Comments: Deferred - mother declined, will check PSA level Musculoskeletal:        General: Normal range of motion.     Cervical back: Normal range of motion and neck supple. No rigidity.  Skin:    General: Skin is warm and dry.     Capillary Refill: Capillary refill takes less than 2 seconds.  Neurological:     General: No focal deficit present.     Mental Status: He is alert and oriented to person, place, and time.     Cranial Nerves: No cranial nerve deficit.     Motor: No weakness.  Psychiatric:        Mood and Affect: Mood normal.        Behavior: Behavior normal.        Thought Content: Thought content normal.        Judgment: Judgment normal.         Assessment And Plan:    1. Health maintenance examination - CBC - Hemoglobin A1c - CMP14+EGFR  2. Encounter for prostate cancer screening Comments: Will check PSA. Will not check manual PSA due to patient behavioral health history and no current symptoms. - PSA  3. Essential hypertension Comments: Blood pressure is well controlled, continue current medications - EKG 12-Lead - CBC - CMP14+EGFR - trandolapril (MAVIK) 2 MG tablet; Take 1 tablet (2 mg total) by mouth daily.  Dispense: 90 tablet; Refill: 1  4. Mixed hyperlipidemia Comments: Cholesterol levels are normal. Continue focusing on low fat diet and increase fiber intake - Lipid  panel - atorvastatin (LIPITOR) 10 MG tablet; TAKE 1 TABLET(10 MG) BY MOUTH DAILY  Dispense: 90 tablet; Refill: 1  5. Vitamin D deficiency Will check vitamin D level and supplement as needed.    Also encouraged to spend 15 minutes in the sun  daily.  - VITAMIN D 25 Hydroxy (Vit-D Deficiency, Fractures)  6. Bipolar II disorder - CMP14+EGFR   Patient was given opportunity to ask questions. Patient verbalized understanding of the plan and was able to repeat key elements of the plan. All questions were answered to their satisfaction.   Arnette Felts, FNP   I, Arnette Felts, FNP, have reviewed all documentation for this visit. The documentation on 11/24/22 for the exam, diagnosis, procedures, and orders are all accurate and complete.  THE PATIENT IS ENCOURAGED TO PRACTICE SOCIAL DISTANCING DUE TO THE COVID-19 PANDEMIC.

## 2022-11-25 LAB — CMP14+EGFR
ALT: 22 IU/L (ref 0–44)
AST: 23 IU/L (ref 0–40)
Albumin/Globulin Ratio: 1.7 (ref 1.2–2.2)
Albumin: 4.6 g/dL (ref 4.1–5.1)
Alkaline Phosphatase: 67 IU/L (ref 44–121)
BUN/Creatinine Ratio: 8 — ABNORMAL LOW (ref 9–20)
BUN: 7 mg/dL (ref 6–24)
Bilirubin Total: 0.5 mg/dL (ref 0.0–1.2)
CO2: 22 mmol/L (ref 20–29)
Calcium: 10.1 mg/dL (ref 8.7–10.2)
Chloride: 104 mmol/L (ref 96–106)
Creatinine, Ser: 0.93 mg/dL (ref 0.76–1.27)
Globulin, Total: 2.7 g/dL (ref 1.5–4.5)
Glucose: 81 mg/dL (ref 70–99)
Potassium: 5.1 mmol/L (ref 3.5–5.2)
Sodium: 141 mmol/L (ref 134–144)
Total Protein: 7.3 g/dL (ref 6.0–8.5)
eGFR: 106 mL/min/{1.73_m2} (ref >59)

## 2022-11-25 LAB — HEMOGLOBIN A1C
Est. average glucose Bld gHb Est-mCnc: 105 mg/dL
Hgb A1c MFr Bld: 5.3 % (ref 4.8–5.6)

## 2022-11-25 LAB — CBC
Hematocrit: 42.6 % (ref 37.5–51.0)
Hemoglobin: 14.4 g/dL (ref 13.0–17.7)
MCH: 28.6 pg (ref 26.6–33.0)
MCHC: 33.8 g/dL (ref 31.5–35.7)
MCV: 85 fL (ref 79–97)
Platelets: 185 10*3/uL (ref 150–450)
RBC: 5.03 x10E6/uL (ref 4.14–5.80)
RDW: 13.1 % (ref 11.6–15.4)
WBC: 4.5 10*3/uL (ref 3.4–10.8)

## 2022-11-25 LAB — PSA: Prostate Specific Ag, Serum: 0.5 ng/mL (ref 0.0–4.0)

## 2022-11-25 LAB — LIPID PANEL
Chol/HDL Ratio: 2.3 ratio (ref 0.0–5.0)
Cholesterol, Total: 145 mg/dL (ref 100–199)
HDL: 63 mg/dL (ref >39)
LDL Chol Calc (NIH): 70 mg/dL (ref 0–99)
Triglycerides: 60 mg/dL (ref 0–149)
VLDL Cholesterol Cal: 12 mg/dL (ref 5–40)

## 2022-11-25 LAB — VITAMIN D 25 HYDROXY (VIT D DEFICIENCY, FRACTURES): Vit D, 25-Hydroxy: 27.7 ng/mL — ABNORMAL LOW (ref 30.0–100.0)

## 2023-03-26 ENCOUNTER — Encounter: Payer: Self-pay | Admitting: Nurse Practitioner

## 2023-03-26 ENCOUNTER — Ambulatory Visit: Payer: MEDICAID | Admitting: Nurse Practitioner

## 2023-03-26 VITALS — BP 136/82 | HR 64 | Temp 98.2°F | Ht 69.0 in | Wt 184.2 lb

## 2023-03-26 DIAGNOSIS — I10 Essential (primary) hypertension: Secondary | ICD-10-CM

## 2023-03-26 DIAGNOSIS — F840 Autistic disorder: Secondary | ICD-10-CM

## 2023-03-26 DIAGNOSIS — E782 Mixed hyperlipidemia: Secondary | ICD-10-CM | POA: Diagnosis not present

## 2023-03-26 DIAGNOSIS — F3181 Bipolar II disorder: Secondary | ICD-10-CM

## 2023-03-26 DIAGNOSIS — E559 Vitamin D deficiency, unspecified: Secondary | ICD-10-CM

## 2023-03-26 MED ORDER — VITAMIN D (ERGOCALCIFEROL) 1.25 MG (50000 UNIT) PO CAPS
50000.0000 [IU] | ORAL_CAPSULE | ORAL | 1 refills | Status: DC
Start: 2023-03-26 — End: 2023-08-06

## 2023-03-26 MED ORDER — ATORVASTATIN CALCIUM 10 MG PO TABS
ORAL_TABLET | ORAL | 1 refills | Status: DC
Start: 2023-03-26 — End: 2023-08-06

## 2023-03-26 MED ORDER — TRANDOLAPRIL 2 MG PO TABS
2.0000 mg | ORAL_TABLET | Freq: Every day | ORAL | 1 refills | Status: DC
Start: 2023-03-26 — End: 2023-08-06

## 2023-03-26 NOTE — Assessment & Plan Note (Signed)
 Continue f/u with Behavioral Health

## 2023-03-26 NOTE — Progress Notes (Signed)
Madelaine Bhat, CMA,acting as a Neurosurgeon for Arnette Felts, FNP.,have documented all relevant documentation on the behalf of Arnette Felts, FNP,as directed by  Arnette Felts, FNP while in the presence of Arnette Felts, FNP.  Subjective:  Patient ID: Matthew Gross , male    DOB: Mar 03, 1981 , 42 y.o.   MRN: [id number]  Chief Complaint  Patient presents with   Hypertension    HPI  Patient presented today for a BP and chol follow up, patient reports compliance with medications. He was given his medications this morning. Drinking approximately 64 oz water a day. Patient denies any chest pain, SOB, or headaches. Patient has no concerns today.  BP Readings from Last 3 Encounters: 03/26/23 : 136/82 11/24/22 : 118/68 08/13/22 : 134/70   Wt Readings from Last 3 Encounters: 03/26/23 : 184 lb 3.2 oz (83.6 kg) 11/24/22 : 185 lb (83.9 kg) 08/13/22 : 190 lb (86.2 kg)       Past Medical History:  Diagnosis Date   Autistic disorder PT GOES TO A DAY PROGRAM DAILY AND HAS ASSISTANCE AT HOME IN THE EVERY   PT MOTHER LEGAL GAUDERIAN   Colon polyps    Depressed bipolar II disorder (HCC)    Hyperlipidemia    Hypertension    Penile cyst    Rectal bleeding 2017   before and colonscopy     Family History  Problem Relation Age of Onset   Heart failure Mother    Hypertension Mother    Hyperlipidemia Mother      Current Outpatient Medications:    buPROPion (WELLBUTRIN XL) 300 MG 24 hr tablet, Take 300 mg by mouth daily. , Disp: , Rfl:    gabapentin (NEURONTIN) 400 MG capsule, Take 800 mg by mouth at bedtime. , Disp: , Rfl: 0   Loratadine (CLARITIN PO), Take 1 capsule by mouth daily., Disp: , Rfl:    risperiDONE (RISPERDAL) 1 MG tablet, Take 1 mg by mouth every other day. , Disp: , Rfl: 0   sertraline (ZOLOFT) 100 MG tablet, Take 150 mg by mouth daily., Disp: , Rfl: 0   triamcinolone cream (KENALOG) 0.1 %, Apply 1 Application topically 2 (two) times daily. Limit application in any 1 area to 2 weeks  at a time before taking a break, do not use in any skin folds, genitals, or on the face, Disp: 80 g, Rfl: 0   Vitamin D, Ergocalciferol, (DRISDOL) 1.25 MG (50000 UNIT) CAPS capsule, Take 1 capsule (50,000 Units total) by mouth every 7 (seven) days., Disp: 12 capsule, Rfl: 1   atorvastatin (LIPITOR) 10 MG tablet, TAKE 1 TABLET(10 MG) BY MOUTH DAILY, Disp: 90 tablet, Rfl: 1   trandolapril (MAVIK) 2 MG tablet, Take 1 tablet (2 mg total) by mouth daily., Disp: 90 tablet, Rfl: 1   Allergies  Allergen Reactions   Penicillins Hives    Has patient had a PCN reaction causing immediate rash, facial/tongue/throat swelling, SOB or lightheadedness with hypotension: Yes Has patient had a PCN reaction causing severe rash involving mucus membranes or skin necrosis: No Has patient had a PCN reaction that required hospitalization No Has patient had a PCN reaction occurring within the last 10 years: Yes If all of the above answers are "NO", then may proceed with Cephalosporin use.   Robaxin [Methocarbamol] Rash     Review of Systems  Constitutional: Negative.   Respiratory: Negative.    Cardiovascular: Negative.  Negative for chest pain, palpitations and leg swelling.  Gastrointestinal: Negative.   Neurological:  Negative.   Psychiatric/Behavioral: Negative.       Today's Vitals   03/26/23 1042  BP: 136/82  Pulse: 64  Temp: 98.2 F (36.8 C)  TempSrc: Oral  Weight: 184 lb 3.2 oz (83.6 kg)  Height: 5\' 9"  (1.753 m)  PainSc: 0-No pain   Body mass index is 27.2 kg/m.  Wt Readings from Last 3 Encounters:  03/26/23 184 lb 3.2 oz (83.6 kg)  11/24/22 185 lb (83.9 kg)  08/13/22 190 lb (86.2 kg)     Objective:  Physical Exam Vitals reviewed.  Constitutional:      General: He is not in acute distress.    Appearance: Normal appearance.  Cardiovascular:     Rate and Rhythm: Normal rate and regular rhythm.     Pulses: Normal pulses.     Heart sounds: Normal heart sounds.  Pulmonary:     Effort:  Pulmonary effort is normal. No respiratory distress.     Breath sounds: Normal breath sounds. No wheezing.  Skin:    General: Skin is warm and dry.     Capillary Refill: Capillary refill takes less than 2 seconds.  Neurological:     General: No focal deficit present.     Mental Status: He is alert and oriented to person, place, and time.     Cranial Nerves: No cranial nerve deficit.  Psychiatric:        Mood and Affect: Mood normal.        Behavior: Behavior normal.        Thought Content: Thought content normal.        Judgment: Judgment normal.     Comments: His mother answers for him         Assessment And Plan:  Essential hypertension Assessment & Plan: Blood pressure is slightly elevated, continue current medications.   Orders: -     Trandolapril; Take 1 tablet (2 mg total) by mouth daily.  Dispense: 90 tablet; Refill: 1  Mixed hyperlipidemia Assessment & Plan: Continue statin, tolerating well. No labs this visit.   Orders: -     Atorvastatin Calcium; TAKE 1 TABLET(10 MG) BY MOUTH DAILY  Dispense: 90 tablet; Refill: 1  Vitamin D deficiency Assessment & Plan: Will check vitamin D level and supplement as needed.    Also encouraged to spend 15 minutes in the sun daily.    Orders: -     Vitamin D (Ergocalciferol); Take 1 capsule (50,000 Units total) by mouth every 7 (seven) days.  Dispense: 12 capsule; Refill: 1  Bipolar II disorder (HCC) Assessment & Plan: Continue f/u with Behavioral Health   Autistic disorder Assessment & Plan: Continue f/u with Behavioral Health    Return for 4 month vitamin d check.   Patient was given opportunity to ask questions. Patient verbalized understanding of the plan and was able to repeat key elements of the plan. All questions were answered to their satisfaction.    Jeanell Sparrow, FNP, have reviewed all documentation for this visit. The documentation on 03/26/23 for the exam, diagnosis, procedures, and orders are all  accurate and complete.   IF YOU HAVE BEEN REFERRED TO A SPECIALIST, IT MAY TAKE 1-2 WEEKS TO SCHEDULE/PROCESS THE REFERRAL. IF YOU HAVE NOT HEARD FROM US/SPECIALIST IN TWO WEEKS, PLEASE GIVE [place] A CALL AT [phone].

## 2023-03-26 NOTE — Assessment & Plan Note (Signed)
Continue statin, tolerating well. No labs this visit.

## 2023-03-26 NOTE — Assessment & Plan Note (Signed)
Will check vitamin D level and supplement as needed.    Also encouraged to spend 15 minutes in the sun daily.   

## 2023-03-26 NOTE — Assessment & Plan Note (Signed)
Blood pressure is slightly elevated, continue current medications.

## 2023-06-02 ENCOUNTER — Ambulatory Visit (INDEPENDENT_AMBULATORY_CARE_PROVIDER_SITE_OTHER): Payer: MEDICAID

## 2023-06-02 VITALS — BP 124/80 | HR 70 | Temp 98.1°F | Ht 69.0 in | Wt 184.0 lb

## 2023-06-02 DIAGNOSIS — Z23 Encounter for immunization: Secondary | ICD-10-CM

## 2023-06-02 NOTE — Progress Notes (Signed)
Patient presents today for a flu vaccine. YL,RMA 

## 2023-08-06 ENCOUNTER — Encounter: Payer: Self-pay | Admitting: Nurse Practitioner

## 2023-08-06 ENCOUNTER — Ambulatory Visit (INDEPENDENT_AMBULATORY_CARE_PROVIDER_SITE_OTHER): Payer: MEDICAID | Admitting: Nurse Practitioner

## 2023-08-06 VITALS — BP 126/80 | HR 76 | Temp 98.4°F | Ht 69.0 in | Wt 191.8 lb

## 2023-08-06 DIAGNOSIS — E559 Vitamin D deficiency, unspecified: Secondary | ICD-10-CM

## 2023-08-06 DIAGNOSIS — R058 Other specified cough: Secondary | ICD-10-CM

## 2023-08-06 DIAGNOSIS — E782 Mixed hyperlipidemia: Secondary | ICD-10-CM

## 2023-08-06 DIAGNOSIS — I10 Essential (primary) hypertension: Secondary | ICD-10-CM

## 2023-08-06 LAB — POCT URINALYSIS DIPSTICK
Bilirubin, UA: NEGATIVE
Blood, UA: NEGATIVE
Glucose, UA: NEGATIVE
Ketones, UA: NEGATIVE
Leukocytes, UA: NEGATIVE
Nitrite, UA: NEGATIVE
Protein, UA: NEGATIVE
Spec Grav, UA: 1.01 (ref 1.010–1.025)
Urobilinogen, UA: 0.2 U/dL
pH, UA: 6.5 (ref 5.0–8.0)

## 2023-08-06 MED ORDER — TRANDOLAPRIL 2 MG PO TABS: 2.0000 mg | ORAL_TABLET | Freq: Every day | ORAL | 1 refills | Status: DC

## 2023-08-06 MED ORDER — VITAMIN D (ERGOCALCIFEROL) 1.25 MG (50000 UNIT) PO CAPS: 50000.0000 [IU] | ORAL_CAPSULE | ORAL | 1 refills | Status: DC

## 2023-08-06 MED ORDER — ATORVASTATIN CALCIUM 10 MG PO TABS: ORAL_TABLET | ORAL | 1 refills | Status: DC

## 2023-08-06 MED ORDER — BENZONATATE 100 MG PO CAPS: 100.0000 mg | ORAL_CAPSULE | Freq: Four times a day (QID) | ORAL | 1 refills | Status: AC | PRN

## 2023-08-06 NOTE — Progress Notes (Signed)
 Matthew Kristeen JINNY Gladis, CMA,acting as a neurosurgeon for Matthew Ada, FNP.,have documented all relevant documentation on the behalf of Matthew Ada, FNP,as directed by  Matthew Ada, FNP while in the presence of Matthew Ada, FNP.  Subjective:  Patient ID: Matthew Gross , male    DOB: September 19, 1980 , 43 y.o.   MRN: [id number]  Chief Complaint  Patient presents with   vitamin D  check   Hypertension    HPI  Patient presents today for a bp and chol follow up, Patient reports compliance with medication. Patient denies any chest pain, SOB, or headaches. Patient mother stated he has been sick since last week he has had some congestion and productive cough. She gave him 2 allergy pills and it stopped his nose from running. She reports his mucus he coughed up has blood in it. When he blows his nose he has had  a little blood in his nasal passage.      Past Medical History:  Diagnosis Date   Autistic disorder PT GOES TO A DAY PROGRAM DAILY AND HAS ASSISTANCE AT HOME IN THE EVERY   PT MOTHER LEGAL GAUDERIAN   Colon polyps    Depressed bipolar II disorder (HCC)    Hyperlipidemia    Hypertension    Penile cyst    Rectal bleeding 2017   before and colonscopy     Family History  Problem Relation Age of Onset   Heart failure Mother    Hypertension Mother    Hyperlipidemia Mother      Current Outpatient Medications:    benzonatate  (TESSALON  PERLES) 100 MG capsule, Take 1 capsule (100 mg total) by mouth every 6 (six) hours as needed., Disp: 30 capsule, Rfl: 1   buPROPion  (WELLBUTRIN  XL) 300 MG 24 hr tablet, Take 300 mg by mouth daily. , Disp: , Rfl:    gabapentin  (NEURONTIN ) 400 MG capsule, Take 800 mg by mouth at bedtime. , Disp: , Rfl: 0   Loratadine (CLARITIN PO), Take 1 capsule by mouth daily., Disp: , Rfl:    risperiDONE  (RISPERDAL ) 1 MG tablet, Take 1 mg by mouth every other day. , Disp: , Rfl: 0   sertraline  (ZOLOFT ) 100 MG tablet, Take 150 mg by mouth daily., Disp: , Rfl: 0   triamcinolone  cream  (KENALOG ) 0.1 %, Apply 1 Application topically 2 (two) times daily. Limit application in any 1 area to 2 weeks at a time before taking a break, do not use in any skin folds, genitals, or on the face, Disp: 80 g, Rfl: 0   atorvastatin  (LIPITOR) 10 MG tablet, TAKE 1 TABLET(10 MG) BY MOUTH DAILY, Disp: 90 tablet, Rfl: 1   trandolapril  (MAVIK ) 2 MG tablet, Take 1 tablet (2 mg total) by mouth daily., Disp: 90 tablet, Rfl: 1   Vitamin D , Ergocalciferol , (DRISDOL ) 1.25 MG (50000 UNIT) CAPS capsule, Take 1 capsule (50,000 Units total) by mouth every 7 (seven) days., Disp: 12 capsule, Rfl: 1   Allergies  Allergen Reactions   Penicillins Hives    Has patient had a PCN reaction causing immediate rash, facial/tongue/throat swelling, SOB or lightheadedness with hypotension: Yes Has patient had a PCN reaction causing severe rash involving mucus membranes or skin necrosis: No Has patient had a PCN reaction that required hospitalization No Has patient had a PCN reaction occurring within the last 10 years: Yes If all of the above answers are NO, then may proceed with Cephalosporin use.   Robaxin  [Methocarbamol ] Rash     Review of Systems  Constitutional: Negative.   HENT:  Positive for congestion.   Eyes: Negative.   Respiratory:  Positive for cough.   Musculoskeletal: Negative.   Skin: Negative.   Neurological: Negative.   Psychiatric/Behavioral: Negative.       Today's Vitals   08/06/23 0929  BP: 126/80  Pulse: 76  Temp: 98.4 F (36.9 C)  TempSrc: Oral  Weight: 191 lb 12.8 oz (87 kg)  Height: 5' 9 (1.753 m)  PainSc: 0-No pain   Body mass index is 28.32 kg/m.  Wt Readings from Last 3 Encounters:  08/06/23 191 lb 12.8 oz (87 kg)  06/02/23 184 lb (83.5 kg)  03/26/23 184 lb 3.2 oz (83.6 kg)    The 10-year ASCVD risk score (Arnett DK, et al., 2019) is: 4.5%   Values used to calculate the score:     Age: 28 years     Sex: Male     Is Non-Hispanic African American: Yes     Diabetic:  No     Tobacco smoker: No     Systolic Blood Pressure: 126 mmHg     Is BP treated: Yes     HDL Cholesterol: 63 mg/dL     Total Cholesterol: 145 mg/dL  Objective:  Physical Exam Vitals reviewed.  Constitutional:      General: He is not in acute distress.    Appearance: Normal appearance.  Cardiovascular:     Rate and Rhythm: Normal rate and regular rhythm.     Pulses: Normal pulses.     Heart sounds: Normal heart sounds.  Pulmonary:     Effort: Pulmonary effort is normal. No respiratory distress.     Breath sounds: Normal breath sounds. No wheezing.  Skin:    General: Skin is warm and dry.     Capillary Refill: Capillary refill takes less than 2 seconds.  Neurological:     General: No focal deficit present.     Mental Status: He is alert and oriented to person, place, and time.     Cranial Nerves: No cranial nerve deficit.  Psychiatric:        Mood and Affect: Mood normal.        Behavior: Behavior normal.        Thought Content: Thought content normal.        Judgment: Judgment normal.     Comments: His mother answers for him         Assessment And Plan:  Essential hypertension Assessment & Plan: Blood pressure is controlled, continue current medications.   Orders: -     BMP8+eGFR -     Microalbumin / creatinine urine ratio -     POCT urinalysis dipstick -     Trandolapril ; Take 1 tablet (2 mg total) by mouth daily.  Dispense: 90 tablet; Refill: 1  Vitamin D  deficiency Assessment & Plan: Will check vitamin D  level and supplement as needed.    Also encouraged to spend 15 minutes in the sun daily.    Orders: -     VITAMIN D  25 Hydroxy (Vit-D Deficiency, Fractures) -     Vitamin D  (Ergocalciferol ); Take 1 capsule (50,000 Units total) by mouth every 7 (seven) days.  Dispense: 12 capsule; Refill: 1  Mixed hyperlipidemia Assessment & Plan: Continue statin, tolerating well. Will check lipid panel. Continue current medications  Orders: -     Lipid panel -      Atorvastatin  Calcium ; TAKE 1 TABLET(10 MG) BY MOUTH DAILY  Dispense: 90 tablet; Refill: 1  Productive cough Assessment & Plan: His symptoms are improving, I did give him a Rx for tessalon  perles. If worsens or persist his mother is to return call to office.   Orders: -     Benzonatate ; Take 1 capsule (100 mg total) by mouth every 6 (six) hours as needed.  Dispense: 30 capsule; Refill: 1    Return if symptoms worsen or fail to improve.  Patient was given opportunity to ask questions. Patient verbalized understanding of the plan and was able to repeat key elements of the plan. All questions were answered to their satisfaction.    Matthew Matthew Ada, FNP, have reviewed all documentation for this visit. The documentation on 08/06/23 for the exam, diagnosis, procedures, and orders are all accurate and complete.   IF YOU HAVE BEEN REFERRED TO A SPECIALIST, IT MAY TAKE 1-2 WEEKS TO SCHEDULE/PROCESS THE REFERRAL. IF YOU HAVE NOT HEARD FROM US /SPECIALIST IN TWO WEEKS, PLEASE GIVE US  A CALL AT [phone].

## 2023-08-06 NOTE — Assessment & Plan Note (Addendum)
 Continue statin, tolerating well. Will check lipid panel. Continue current medications

## 2023-08-06 NOTE — Assessment & Plan Note (Signed)
 Will check vitamin D level and supplement as needed.    Also encouraged to spend 15 minutes in the sun daily.

## 2023-08-06 NOTE — Assessment & Plan Note (Signed)
 Blood pressure is controlled, continue current medications

## 2023-08-06 NOTE — Patient Instructions (Signed)
 Hypertension, Adult Hypertension is another name for high blood pressure. High blood pressure forces your heart to work harder to pump blood. This can cause problems over time. There are two numbers in a blood pressure reading. There is a top number (systolic) over a bottom number (diastolic). It is best to have a blood pressure that is below 120/80. What are the causes? The cause of this condition is not known. Some other conditions can lead to high blood pressure. What increases the risk? Some lifestyle factors can make you more likely to develop high blood pressure: Smoking. Not getting enough exercise or physical activity. Being overweight. Having too much fat, sugar, calories, or salt (sodium) in your diet. Drinking too much alcohol. Other risk factors include: Having any of these conditions: Heart disease. Diabetes. High cholesterol. Kidney disease. Obstructive sleep apnea. Having a family history of high blood pressure and high cholesterol. Age. The risk increases with age. Stress. What are the signs or symptoms? High blood pressure may not cause symptoms. Very high blood pressure (hypertensive crisis) may cause: Headache. Fast or uneven heartbeats (palpitations). Shortness of breath. Nosebleed. Vomiting or feeling like you may vomit (nauseous). Changes in how you see. Very bad chest pain. Feeling dizzy. Seizures. How is this treated? This condition is treated by making healthy lifestyle changes, such as: Eating healthy foods. Exercising more. Drinking less alcohol. Your doctor may prescribe medicine if lifestyle changes do not help enough and if: Your top number is above 130. Your bottom number is above 80. Your personal target blood pressure may vary. Follow these instructions at home: Eating and drinking  If told, follow the DASH eating plan. To follow this plan: Fill one half of your plate at each meal with fruits and vegetables. Fill one fourth of your plate  at each meal with whole grains. Whole grains include whole-wheat pasta, brown rice, and whole-grain bread. Eat or drink low-fat dairy products, such as skim milk or low-fat yogurt. Fill one fourth of your plate at each meal with low-fat (lean) proteins. Low-fat proteins include fish, chicken without skin, eggs, beans, and tofu. Avoid fatty meat, cured and processed meat, or chicken with skin. Avoid pre-made or processed food. Limit the amount of salt in your diet to less than 1,500 mg each day. Do not drink alcohol if: Your doctor tells you not to drink. You are pregnant, may be pregnant, or are planning to become pregnant. If you drink alcohol: Limit how much you have to: 0-1 drink a day for women. 0-2 drinks a day for men. Know how much alcohol is in your drink. In the [place] one drink equals one 12 oz bottle of beer (355 mL), one 5 oz glass of wine (148 mL), or one 1 oz glass of hard liquor (44 mL). Lifestyle  Work with your doctor to stay at a healthy weight or to lose weight. Ask your doctor what the best weight is for you. Get at least 30 minutes of exercise that causes your heart to beat faster (aerobic exercise) most days of the week. This may include walking, swimming, or biking. Get at least 30 minutes of exercise that strengthens your muscles (resistance exercise) at least 3 days a week. This may include lifting weights or doing Pilates. Do not smoke or use any products that contain nicotine or tobacco. If you need help quitting, ask your doctor. Check your blood pressure at home as told by your doctor. Keep all follow-up visits. Medicines Take over-the-counter and prescription medicines  only as told by your doctor. Follow directions carefully. Do not skip doses of blood pressure medicine. The medicine does not work as well if you skip doses. Skipping doses also puts you at risk for problems. Ask your doctor about side effects or reactions to medicines that you should watch  for. Contact a doctor if: You think you are having a reaction to the medicine you are taking. You have headaches that keep coming back. You feel dizzy. You have swelling in your ankles. You have trouble with your vision. Get help right away if: You get a very bad headache. You start to feel mixed up (confused). You feel weak or numb. You feel faint. You have very bad pain in your: Chest. Belly (abdomen). You vomit more than once. You have trouble breathing. These symptoms may be an emergency. Get help right away. Call 911. Do not wait to see if the symptoms will go away. Do not drive yourself to the hospital. Summary Hypertension is another name for high blood pressure. High blood pressure forces your heart to work harder to pump blood. For most people, a normal blood pressure is less than 120/80. Making healthy choices can help lower blood pressure. If your blood pressure does not get lower with healthy choices, you may need to take medicine. This information is not intended to replace advice given to you by your health care provider. Make sure you discuss any questions you have with your health care provider. Document Revised: 05/09/2021 Document Reviewed: 05/09/2021 Elsevier Patient Education  2024 [employer].

## 2023-08-06 NOTE — Assessment & Plan Note (Signed)
 His symptoms are improving, I did give him a Rx for tessalon perles. If worsens or persist his mother is to return call to office.

## 2023-08-07 LAB — LIPID PANEL
Chol/HDL Ratio: 2.4 {ratio} (ref 0.0–5.0)
Cholesterol, Total: 145 mg/dL (ref 100–199)
HDL: 60 mg/dL (ref >39)
LDL Chol Calc (NIH): 75 mg/dL (ref 0–99)
Triglycerides: 44 mg/dL (ref 0–149)
VLDL Cholesterol Cal: 10 mg/dL (ref 5–40)

## 2023-08-07 LAB — MICROALBUMIN / CREATININE URINE RATIO
Creatinine, Urine: 28.4 mg/dL
Microalb/Creat Ratio: <11 mg/g{creat} (ref 0–29)
Microalbumin, Urine: <3 ug/mL

## 2023-08-07 LAB — BMP8+EGFR
BUN/Creatinine Ratio: 12 (ref 9–20)
BUN: 13 mg/dL (ref 6–24)
CO2: 24 mmol/L (ref 20–29)
Calcium: 9.3 mg/dL (ref 8.7–10.2)
Chloride: 104 mmol/L (ref 96–106)
Creatinine, Ser: 1.13 mg/dL (ref 0.76–1.27)
Glucose: 65 mg/dL — ABNORMAL LOW (ref 70–99)
Potassium: 4.7 mmol/L (ref 3.5–5.2)
Sodium: 140 mmol/L (ref 134–144)
eGFR: 84 mL/min/{1.73_m2} (ref >59)

## 2023-08-07 LAB — VITAMIN D 25 HYDROXY (VIT D DEFICIENCY, FRACTURES): Vit D, 25-Hydroxy: 25.6 ng/mL — ABNORMAL LOW (ref 30.0–100.0)

## 2023-09-15 ENCOUNTER — Other Ambulatory Visit: Payer: Self-pay | Admitting: Nurse Practitioner

## 2023-09-15 DIAGNOSIS — E559 Vitamin D deficiency, unspecified: Secondary | ICD-10-CM

## 2023-11-25 ENCOUNTER — Ambulatory Visit (INDEPENDENT_AMBULATORY_CARE_PROVIDER_SITE_OTHER): Payer: MEDICAID | Admitting: Nurse Practitioner

## 2023-11-25 ENCOUNTER — Encounter: Payer: Self-pay | Admitting: Nurse Practitioner

## 2023-11-25 VITALS — BP 122/80 | HR 68 | Temp 97.9°F | Ht 69.0 in | Wt 193.6 lb

## 2023-11-25 DIAGNOSIS — Z0000 Encounter for general adult medical examination without abnormal findings: Secondary | ICD-10-CM

## 2023-11-25 DIAGNOSIS — F840 Autistic disorder: Secondary | ICD-10-CM

## 2023-11-25 DIAGNOSIS — E782 Mixed hyperlipidemia: Secondary | ICD-10-CM | POA: Diagnosis not present

## 2023-11-25 DIAGNOSIS — E559 Vitamin D deficiency, unspecified: Secondary | ICD-10-CM

## 2023-11-25 DIAGNOSIS — Z6828 Body mass index (BMI) 28.0-28.9, adult: Secondary | ICD-10-CM

## 2023-11-25 DIAGNOSIS — Z2821 Immunization not carried out because of patient refusal: Secondary | ICD-10-CM

## 2023-11-25 DIAGNOSIS — Z125 Encounter for screening for malignant neoplasm of prostate: Secondary | ICD-10-CM

## 2023-11-25 DIAGNOSIS — I10 Essential (primary) hypertension: Secondary | ICD-10-CM

## 2023-11-25 DIAGNOSIS — E663 Overweight: Secondary | ICD-10-CM

## 2023-11-25 DIAGNOSIS — F3181 Bipolar II disorder: Secondary | ICD-10-CM | POA: Diagnosis not present

## 2023-11-25 DIAGNOSIS — Z79899 Other long term (current) drug therapy: Secondary | ICD-10-CM

## 2023-11-25 LAB — POCT URINALYSIS DIP (CLINITEK)
Bilirubin, UA: NEGATIVE
Blood, UA: NEGATIVE
Glucose, UA: NEGATIVE mg/dL
Ketones, POC UA: NEGATIVE mg/dL
Leukocytes, UA: NEGATIVE
Nitrite, UA: NEGATIVE
POC PROTEIN,UA: NEGATIVE
Spec Grav, UA: 1.015 (ref 1.010–1.025)
Urobilinogen, UA: 0.2 U/dL
pH, UA: 7 (ref 5.0–8.0)

## 2023-11-25 MED ORDER — VITAMIN D (ERGOCALCIFEROL) 1.25 MG (50000 UNIT) PO CAPS: 50000.0000 [IU] | ORAL_CAPSULE | ORAL | 1 refills | Status: DC

## 2023-11-25 MED ORDER — ATORVASTATIN CALCIUM 10 MG PO TABS: ORAL_TABLET | ORAL | 1 refills | Status: DC

## 2023-11-25 MED ORDER — TRANDOLAPRIL 2 MG PO TABS: 2.0000 mg | ORAL_TABLET | Freq: Every day | ORAL | 1 refills | Status: DC

## 2023-11-25 NOTE — Assessment & Plan Note (Signed)
 Continue statin, tolerating well. Will check lipid panel. Continue current medications

## 2023-11-25 NOTE — Progress Notes (Deleted)
 Del Favia, CMA,acting as a Neurosurgeon for Susanna Epley, FNP.,have documented all relevant documentation on the behalf of Susanna Epley, FNP,as directed by  Susanna Epley, FNP while in the presence of Susanna Epley, FNP.  Subjective:  Patient ID: Matthew Gross , male    DOB: 11/15/1980 , 43 y.o.   MRN: [id number]  No chief complaint on file.   HPI  Patient presents today for a bp and chol follow up, Patient reports compliance with medication. Patient denies any chest pain, SOB, or headaches. Patient has no concerns today.     Past Medical History:  Diagnosis Date  . Autistic disorder PT GOES TO A DAY PROGRAM DAILY AND HAS ASSISTANCE AT HOME IN THE EVERY   PT MOTHER LEGAL GAUDERIAN  . Colon polyps   . Depressed bipolar II disorder (HCC)   . Hyperlipidemia   . Hypertension   . Penile cyst   . Rectal bleeding 2017   before and colonscopy     Family History  Problem Relation Age of Onset  . Heart failure Mother   . Hypertension Mother   . Hyperlipidemia Mother      Current Outpatient Medications:  .  atorvastatin  (LIPITOR) 10 MG tablet, TAKE 1 TABLET(10 MG) BY MOUTH DAILY, Disp: 90 tablet, Rfl: 1 .  benzonatate  (TESSALON  PERLES) 100 MG capsule, Take 1 capsule (100 mg total) by mouth every 6 (six) hours as needed., Disp: 30 capsule, Rfl: 1 .  buPROPion  (WELLBUTRIN  XL) 300 MG 24 hr tablet, Take 300 mg by mouth daily. , Disp: , Rfl:  .  gabapentin  (NEURONTIN ) 400 MG capsule, Take 800 mg by mouth at bedtime. , Disp: , Rfl: 0 .  Loratadine (CLARITIN PO), Take 1 capsule by mouth daily., Disp: , Rfl:  .  risperiDONE  (RISPERDAL ) 1 MG tablet, Take 1 mg by mouth every other day. , Disp: , Rfl: 0 .  sertraline  (ZOLOFT ) 100 MG tablet, Take 150 mg by mouth daily., Disp: , Rfl: 0 .  trandolapril  (MAVIK ) 2 MG tablet, Take 1 tablet (2 mg total) by mouth daily., Disp: 90 tablet, Rfl: 1 .  triamcinolone  cream (KENALOG ) 0.1 %, Apply 1 Application topically 2 (two) times daily. Limit application in  any 1 area to 2 weeks at a time before taking a break, do not use in any skin folds, genitals, or on the face, Disp: 80 g, Rfl: 0 .  Vitamin D , Ergocalciferol , (DRISDOL ) 1.25 MG (50000 UNIT) CAPS capsule, TAKE 1 CAPSULE BY MOUTH EVERY 7 DAYS, Disp: 12 capsule, Rfl: 1   Allergies  Allergen Reactions  . Penicillins Hives    Has patient had a PCN reaction causing immediate rash, facial/tongue/throat swelling, SOB or lightheadedness with hypotension: Yes Has patient had a PCN reaction causing severe rash involving mucus membranes or skin necrosis: No Has patient had a PCN reaction that required hospitalization No Has patient had a PCN reaction occurring within the last 10 years: Yes If all of the above answers are "NO", then may proceed with Cephalosporin use.  . Robaxin  [Methocarbamol ] Rash     Review of Systems   There were no vitals filed for this visit. There is no height or weight on file to calculate BMI.  Wt Readings from Last 3 Encounters:  08/06/23 191 lb 12.8 oz (87 kg)  06/02/23 184 lb (83.5 kg)  03/26/23 184 lb 3.2 oz (83.6 kg)    The 10-year ASCVD risk score (Arnett DK, et al., 2019) is: 4.8%   Values used  to calculate the score:     Age: 67 years     Sex: Male     Is Non-Hispanic African American: Yes     Diabetic: No     Tobacco smoker: No     Systolic Blood Pressure: 126 mmHg     Is BP treated: Yes     HDL Cholesterol: 60 mg/dL     Total Cholesterol: 145 mg/dL  Objective:  Physical Exam      Assessment And Plan:  Essential hypertension  Mixed hyperlipidemia  Bipolar II disorder (HCC)  Autistic disorder    No follow-ups on file.  Patient was given opportunity to ask questions. Patient verbalized understanding of the plan and was able to repeat key elements of the plan. All questions were answered to their satisfaction.    Inge Mangle, FNP, have reviewed all documentation for this visit. The documentation on 11/25/23 for the exam, diagnosis,  procedures, and orders are all accurate and complete.   IF YOU HAVE BEEN REFERRED TO A SPECIALIST, IT MAY TAKE 1-2 WEEKS TO SCHEDULE/PROCESS THE REFERRAL. IF YOU HAVE NOT HEARD FROM US /SPECIALIST IN TWO WEEKS, PLEASE GIVE US  A CALL AT [phone].

## 2023-11-25 NOTE — Assessment & Plan Note (Signed)
 Continue f/u with Behavioral Health

## 2023-11-25 NOTE — Assessment & Plan Note (Signed)
 Vitamin d  levels remain low Will check vitamin D  level and supplement as needed.    Also encouraged to spend 15 minutes in the sun daily.

## 2023-11-25 NOTE — Patient Instructions (Signed)
 Health Maintenance  Topic Date Due   COVID-19 Vaccine (4 - 2024-25 season) 12/11/2023*   Flu Shot  03/04/2024   Colon Cancer Screening  09/13/2027   DTaP/Tdap/Td vaccine (3 - Td or Tdap) 11/16/2030   Hepatitis C Screening  Completed   HIV Screening  Completed   HPV Vaccine  Aged Out   Meningitis B Vaccine  Aged Out  *Topic was postponed. The date shown is not the original due date.

## 2023-11-25 NOTE — Assessment & Plan Note (Signed)
 Blood pressure is controlled, continue current medications

## 2023-11-25 NOTE — Assessment & Plan Note (Signed)

## 2023-11-25 NOTE — Progress Notes (Signed)
 [place], CMA,acting as a Neurosurgeon for Susanna Epley, FNP.,have documented all relevant documentation on the behalf of Susanna Epley, FNP,as directed by  Susanna Epley, FNP while in the presence of Susanna Epley, FNP.  Subjective:   Patient ID: Matthew Gross , male    DOB: [birth year] , 43 y.o.   MRN: [id number]  Chief Complaint  Patient presents with   Annual Exam    HPI  Patient presents today for HM, Patient reports compliance with medication. Patient denies any chest pain, SOB, or headaches. Patient has no concerns today. Continues with behavioral health provider.     Past Medical History:  Diagnosis Date   Autistic disorder PT GOES TO A DAY PROGRAM DAILY AND HAS ASSISTANCE AT HOME IN THE EVERY   PT MOTHER LEGAL GAUDERIAN   Colon polyps    Depressed bipolar II disorder (HCC)    Hyperlipidemia    Hypertension    Penile cyst    Rectal bleeding 2017   before and colonscopy     Family History  Problem Relation Age of Onset   Heart failure Mother    Hypertension Mother    Hyperlipidemia Mother      Current Outpatient Medications:    benzonatate  (TESSALON  PERLES) 100 MG capsule, Take 1 capsule (100 mg total) by mouth every 6 (six) hours as needed., Disp: 30 capsule, Rfl: 1   buPROPion  (WELLBUTRIN  XL) 300 MG 24 hr tablet, Take 300 mg by mouth daily. , Disp: , Rfl:    gabapentin  (NEURONTIN ) 400 MG capsule, Take 800 mg by mouth at bedtime. , Disp: , Rfl: 0   Loratadine (CLARITIN PO), Take 1 capsule by mouth daily., Disp: , Rfl:    risperiDONE  (RISPERDAL ) 1 MG tablet, Take 1 mg by mouth every other day. , Disp: , Rfl: 0   sertraline  (ZOLOFT ) 100 MG tablet, Take 150 mg by mouth daily., Disp: , Rfl: 0   triamcinolone  cream (KENALOG ) 0.1 %, Apply 1 Application topically 2 (two) times daily. Limit application in any 1 area to 2 weeks at a time before taking a break, do not use in any skin folds, genitals, or on the face, Disp: 80 g, Rfl: 0   atorvastatin  (LIPITOR) 10 MG tablet, TAKE  1 TABLET(10 MG) BY MOUTH DAILY, Disp: 90 tablet, Rfl: 1   trandolapril  (MAVIK ) 2 MG tablet, Take 1 tablet (2 mg total) by mouth daily., Disp: 90 tablet, Rfl: 1   Vitamin D , Ergocalciferol , (DRISDOL ) 1.25 MG (50000 UNIT) CAPS capsule, Take 1 capsule (50,000 Units total) by mouth every 7 (seven) days., Disp: 12 capsule, Rfl: 1   Allergies  Allergen Reactions   Penicillins Hives    Has patient had a PCN reaction causing immediate rash, facial/tongue/throat swelling, SOB or lightheadedness with hypotension: Yes Has patient had a PCN reaction causing severe rash involving mucus membranes or skin necrosis: No Has patient had a PCN reaction that required hospitalization No Has patient had a PCN reaction occurring within the last 10 years: Yes If all of the above answers are "NO", then may proceed with Cephalosporin use.   Robaxin  [Methocarbamol ] Rash     Men's preventive visit. Patient Health Questionnaire (PHQ-2) is  Flowsheet Row Office Visit from 08/06/2023 in [facility]  PHQ-2 Total Score 0      Patient is on a Regular diet. Exercise - daily walking with his activities throughout the day. Marital status: Single. Relevant history for alcohol use is:  Social History  Substance and Sexual Activity  Alcohol Use No   Relevant history for tobacco use is:  Social History   Tobacco Use  Smoking Status Never  Smokeless Tobacco Never  .   Review of Systems  Constitutional: Negative.   HENT: Negative.    Eyes: Negative.   Respiratory: Negative.    Cardiovascular: Negative.   Gastrointestinal: Negative.   Endocrine: Negative.   Genitourinary: Negative.   Musculoskeletal: Negative.   Allergic/Immunologic: Negative.   Neurological: Negative.   Hematological: Negative.   Psychiatric/Behavioral: Negative.       Today's Vitals   11/25/23 1048  BP: 122/80  Pulse: 68  Temp: 97.9 F (36.6 C)  TempSrc: Oral  Weight: 193 lb 9.6 oz (87.8 kg)   Height: 5\' 9"  (1.753 m)  PainSc: 0-No pain   Body mass index is 28.59 kg/m.  Wt Readings from Last 3 Encounters:  11/25/23 193 lb 9.6 oz (87.8 kg)  08/06/23 191 lb 12.8 oz (87 kg)  06/02/23 184 lb (83.5 kg)    Objective:  Physical Exam Vitals reviewed.  Constitutional:      General: He is not in acute distress.    Appearance: Normal appearance.  HENT:     Head: Normocephalic.     Right Ear: Tympanic membrane, ear canal and external ear normal. There is no impacted cerumen.     Left Ear: Tympanic membrane, ear canal and external ear normal. There is no impacted cerumen.     Nose: Nose normal.     Mouth/Throat:     Mouth: Mucous membranes are moist.  Eyes:     Extraocular Movements: Extraocular movements intact.     Conjunctiva/sclera: Conjunctivae normal.     Pupils: Pupils are equal, round, and reactive to light.  Cardiovascular:     Rate and Rhythm: Normal rate and regular rhythm.     Pulses: Normal pulses.     Heart sounds: Normal heart sounds. No murmur heard. Pulmonary:     Effort: Pulmonary effort is normal. No respiratory distress.     Breath sounds: Normal breath sounds. No wheezing.  Abdominal:     General: Abdomen is flat. Bowel sounds are normal. There is no distension.     Palpations: Abdomen is soft. There is no mass.     Tenderness: There is no abdominal tenderness. There is no guarding.  Genitourinary:    Comments: Deferred - mother declined, will check PSA level Musculoskeletal:        General: Normal range of motion.     Cervical back: Normal range of motion and neck supple. No rigidity.  Skin:    General: Skin is warm and dry.     Capillary Refill: Capillary refill takes less than 2 seconds.  Neurological:     General: No focal deficit present.     Mental Status: He is alert and oriented to person, place, and time.     Cranial Nerves: No cranial nerve deficit.     Motor: No weakness.  Psychiatric:        Mood and Affect: Mood normal.         Behavior: Behavior normal.        Thought Content: Thought content normal.        Judgment: Judgment normal.         Assessment And Plan:    Essential hypertension Assessment & Plan: Blood pressure is controlled, continue current medications.   Orders: -     EKG 12-Lead -  POCT URINALYSIS DIP (CLINITEK) -     Microalbumin / creatinine urine ratio -     Trandolapril ; Take 1 tablet (2 mg total) by mouth daily.  Dispense: 90 tablet; Refill: 1  Encounter for annual health examination Assessment & Plan: Behavior modifications discussed and diet history reviewed.   Pt will continue to exercise regularly and modify diet with low GI, plant based foods and decrease intake of processed foods.  Recommend intake of daily multivitamin, Vitamin D , and calcium .  Recommend for preventive screenings, as well as recommend immunizations that include influenza, TDAP    Mixed hyperlipidemia Assessment & Plan: Continue statin, tolerating well. Will check lipid panel. Continue current medications  Orders: -     CMP14+EGFR -     Lipid panel -     Atorvastatin  Calcium ; TAKE 1 TABLET(10 MG) BY MOUTH DAILY  Dispense: 90 tablet; Refill: 1  Bipolar II disorder (HCC) Assessment & Plan: Continue f/u with Behavioral Health   Autistic disorder Assessment & Plan: Continue f/u with Behavioral Health   COVID-19 vaccination declined Assessment & Plan: Declines covid 19 vaccine. Discussed risk of covid 2 and if he changes her mind about the vaccine to call the office. Education has been provided regarding the importance of this vaccine but patient still declined. Advised may receive this vaccine at local pharmacy or Health Dept.or vaccine clinic. Aware to provide a copy of the vaccination record if obtained from local pharmacy or Health Dept.  Encouraged to take multivitamin, vitamin d , vitamin c and zinc to increase immune system. Aware can call office if would like to have vaccine here at office.  Verbalized acceptance and understanding.    Overweight with body mass index (BMI) of 28 to 28.9 in adult  Encounter for prostate cancer screening -     PSA  Vitamin D  deficiency Assessment & Plan: Vitamin d  levels remain low Will check vitamin D  level and supplement as needed.    Also encouraged to spend 15 minutes in the sun daily.    Orders: -     VITAMIN D  25 Hydroxy (Vit-D Deficiency, Fractures) -     Vitamin D  (Ergocalciferol ); Take 1 capsule (50,000 Units total) by mouth every 7 (seven) days.  Dispense: 12 capsule; Refill: 1  Other long term (current) drug therapy -     CBC with Differential/Platelet     Return for 1 year physical, 6 month bp check. Patient was given opportunity to ask questions. Patient verbalized understanding of the plan and was able to repeat key elements of the plan. All questions were answered to their satisfaction.   Susanna Epley, FNP  I, Susanna Epley, FNP, have reviewed all documentation for this visit. The documentation on 11/25/23 for the exam, diagnosis, procedures, and orders are all accurate and complete.

## 2023-11-26 LAB — VITAMIN D 25 HYDROXY (VIT D DEFICIENCY, FRACTURES): Vit D, 25-Hydroxy: 23.5 ng/mL — ABNORMAL LOW (ref 30.0–100.0)

## 2023-11-26 LAB — LIPID PANEL
Chol/HDL Ratio: 2.3 ratio (ref 0.0–5.0)
Cholesterol, Total: 143 mg/dL (ref 100–199)
HDL: 62 mg/dL (ref >39)
LDL Chol Calc (NIH): 70 mg/dL (ref 0–99)
Triglycerides: 53 mg/dL (ref 0–149)
VLDL Cholesterol Cal: 11 mg/dL (ref 5–40)

## 2023-11-26 LAB — CBC WITH DIFFERENTIAL/PLATELET
Basophils Absolute: 0 10*3/uL (ref 0.0–0.2)
Basos: 1 %
EOS (ABSOLUTE): 0.2 10*3/uL (ref 0.0–0.4)
Eos: 5 %
Hematocrit: 42.5 % (ref 37.5–51.0)
Hemoglobin: 14.4 g/dL (ref 13.0–17.7)
Immature Grans (Abs): 0 10*3/uL (ref 0.0–0.1)
Immature Granulocytes: 0 %
Lymphocytes Absolute: 1.8 10*3/uL (ref 0.7–3.1)
Lymphs: 40 %
MCH: 29.2 pg (ref 26.6–33.0)
MCHC: 33.9 g/dL (ref 31.5–35.7)
MCV: 86 fL (ref 79–97)
Monocytes Absolute: 0.4 10*3/uL (ref 0.1–0.9)
Monocytes: 8 %
Neutrophils Absolute: 2.1 10*3/uL (ref 1.4–7.0)
Neutrophils: 46 %
Platelets: 219 10*3/uL (ref 150–450)
RBC: 4.93 x10E6/uL (ref 4.14–5.80)
RDW: 13.3 % (ref 11.6–15.4)
WBC: 4.6 10*3/uL (ref 3.4–10.8)

## 2023-11-26 LAB — CMP14+EGFR
ALT: 27 IU/L (ref 0–44)
AST: 24 IU/L (ref 0–40)
Albumin: 4.6 g/dL (ref 4.1–5.1)
Alkaline Phosphatase: 70 IU/L (ref 44–121)
BUN/Creatinine Ratio: 10 (ref 9–20)
BUN: 10 mg/dL (ref 6–24)
Bilirubin Total: 0.5 mg/dL (ref 0.0–1.2)
CO2: 25 mmol/L (ref 20–29)
Calcium: 9.5 mg/dL (ref 8.7–10.2)
Chloride: 101 mmol/L (ref 96–106)
Creatinine, Ser: 0.98 mg/dL (ref 0.76–1.27)
Globulin, Total: 2.8 g/dL (ref 1.5–4.5)
Glucose: 72 mg/dL (ref 70–99)
Potassium: 4.7 mmol/L (ref 3.5–5.2)
Sodium: 138 mmol/L (ref 134–144)
Total Protein: 7.4 g/dL (ref 6.0–8.5)
eGFR: 99 mL/min/{1.73_m2} (ref >59)

## 2023-11-26 LAB — MICROALBUMIN / CREATININE URINE RATIO
Creatinine, Urine: 52.3 mg/dL
Microalb/Creat Ratio: <6 mg/g{creat} (ref 0–29)
Microalbumin, Urine: <3 ug/mL

## 2023-11-26 LAB — PSA: Prostate Specific Ag, Serum: 0.5 ng/mL (ref 0.0–4.0)

## 2023-12-07 DIAGNOSIS — Z0000 Encounter for general adult medical examination without abnormal findings: Secondary | ICD-10-CM | POA: Insufficient documentation

## 2023-12-07 NOTE — Assessment & Plan Note (Signed)
 Behavior modifications discussed and diet history reviewed.   Pt will continue to exercise regularly and modify diet with low GI, plant based foods and decrease intake of processed foods.  Recommend intake of daily multivitamin, Vitamin D, and calcium.  Recommend for preventive screenings, as well as recommend immunizations that include influenza, TDAP

## 2024-04-18 ENCOUNTER — Other Ambulatory Visit: Payer: Self-pay | Admitting: Nurse Practitioner

## 2024-04-18 DIAGNOSIS — E782 Mixed hyperlipidemia: Secondary | ICD-10-CM

## 2024-05-13 ENCOUNTER — Ambulatory Visit: Payer: MEDICAID

## 2024-05-13 VITALS — BP 122/80 | HR 65 | Temp 98.1°F | Ht 69.0 in | Wt 193.0 lb

## 2024-05-13 DIAGNOSIS — Z23 Encounter for immunization: Secondary | ICD-10-CM

## 2024-05-13 NOTE — Patient Instructions (Signed)

## 2024-05-13 NOTE — Progress Notes (Addendum)
 Patient is in office today for a nurse visit for FLU Immunization. Patient Injection was given in the  Right deltoid. Patient tolerated injection well.

## 2024-05-30 ENCOUNTER — Ambulatory Visit (INDEPENDENT_AMBULATORY_CARE_PROVIDER_SITE_OTHER): Payer: MEDICAID | Admitting: Nurse Practitioner

## 2024-05-30 ENCOUNTER — Encounter: Payer: Self-pay | Admitting: Nurse Practitioner

## 2024-05-30 VITALS — BP 120/80 | HR 64 | Temp 98.7°F | Ht 69.0 in | Wt 203.0 lb

## 2024-05-30 DIAGNOSIS — I10 Essential (primary) hypertension: Secondary | ICD-10-CM | POA: Diagnosis not present

## 2024-05-30 DIAGNOSIS — E782 Mixed hyperlipidemia: Secondary | ICD-10-CM

## 2024-05-30 DIAGNOSIS — Z79899 Other long term (current) drug therapy: Secondary | ICD-10-CM

## 2024-05-30 DIAGNOSIS — F840 Autistic disorder: Secondary | ICD-10-CM

## 2024-05-30 DIAGNOSIS — E559 Vitamin D deficiency, unspecified: Secondary | ICD-10-CM

## 2024-05-30 DIAGNOSIS — F3181 Bipolar II disorder: Secondary | ICD-10-CM | POA: Diagnosis not present

## 2024-05-30 DIAGNOSIS — Z139 Encounter for screening, unspecified: Secondary | ICD-10-CM

## 2024-05-30 MED ORDER — VITAMIN D (ERGOCALCIFEROL) 1.25 MG (50000 UNIT) PO CAPS: 50000.0000 [IU] | ORAL_CAPSULE | ORAL | 1 refills | Status: AC

## 2024-05-30 MED ORDER — TRANDOLAPRIL 2 MG PO TABS: 2.0000 mg | ORAL_TABLET | Freq: Every day | ORAL | 1 refills | Status: AC

## 2024-05-30 NOTE — Assessment & Plan Note (Signed)
 Continue f/u up with Devere Showman, will fax labs

## 2024-05-30 NOTE — Assessment & Plan Note (Signed)
 Blood pressure is well controlled, continue current medications.

## 2024-05-30 NOTE — Assessment & Plan Note (Signed)
 Atorvastatin  refilled, ongoing management. - Check cholesterol levels.

## 2024-05-30 NOTE — Progress Notes (Signed)
 LILLETTE Kristeen JINNY Gladis, CMA,acting as a neurosurgeon for Gaines Ada, FNP.,have documented all relevant documentation on the behalf of Gaines Ada, FNP,as directed by  Gaines Ada, FNP while in the presence of Gaines Ada, FNP.  Subjective:  Patient ID: Matthew Gross , male    DOB: Mar 15, 1981 , 43 y.o.   MRN: [id number]  Chief Complaint  Patient presents with   Hypertension    Patient presents today for a bp and chol follow up, Patient reports compliance with medication. Patient denies any chest pain, SOB, or headaches. Patient has no concerns today.     HPI Discussed the use of AI scribe software for clinical note transcription with the patient, who gave verbal consent to proceed.  History of Present Illness Matthew Gross is a 43 year old male with hypertension who presents for a blood pressure follow-up and cholesterol check.  He has not experienced any issues with his current antihypertensive medications.  He is currently taking atorvastatin , which has been recently refilled, and requires refills for his other medications. There have been no reported side effects or issues with his medication regimen.  He has gained approximately 10 pounds since April, attributed to dining out frequently with his new coworker. Despite the weight gain, he maintains a membership at the [facility] and continues to exercise regularly.  Hypertension This is a chronic problem. The current episode started more than 1 year ago. The problem is unchanged. The problem is controlled. Pertinent negatives include no anxiety, chest pain or palpitations. There are no associated agents to hypertension. Risk factors for coronary artery disease include sedentary lifestyle. Past treatments include ACE inhibitors. The current treatment provides significant improvement. There are no compliance problems.  There is no history of angina or kidney disease. There is no history of chronic renal disease.  Hyperlipidemia He has no history of chronic  renal disease. Pertinent negatives include no chest pain.     Past Medical History:  Diagnosis Date   Autistic disorder PT GOES TO A DAY PROGRAM DAILY AND HAS ASSISTANCE AT HOME IN THE EVERY   PT MOTHER LEGAL GAUDERIAN   Colon polyps    Depressed bipolar II disorder (HCC)    Hyperlipidemia    Hypertension    Penile cyst    Rectal bleeding 2017   before and colonscopy     Family History  Problem Relation Age of Onset   Heart failure Mother    Hypertension Mother    Hyperlipidemia Mother      Current Outpatient Medications:    atorvastatin  (LIPITOR) 10 MG tablet, TAKE 1 TABLET(10 MG) BY MOUTH DAILY, Disp: 90 tablet, Rfl: 1   benzonatate  (TESSALON  PERLES) 100 MG capsule, Take 1 capsule (100 mg total) by mouth every 6 (six) hours as needed., Disp: 30 capsule, Rfl: 1   buPROPion  (WELLBUTRIN  XL) 300 MG 24 hr tablet, Take 300 mg by mouth daily. , Disp: , Rfl:    gabapentin  (NEURONTIN ) 400 MG capsule, Take 800 mg by mouth at bedtime. , Disp: , Rfl: 0   Loratadine (CLARITIN PO), Take 1 capsule by mouth daily., Disp: , Rfl:    risperiDONE  (RISPERDAL ) 1 MG tablet, Take 1 mg by mouth every other day. , Disp: , Rfl: 0   sertraline  (ZOLOFT ) 100 MG tablet, Take 150 mg by mouth daily., Disp: , Rfl: 0   triamcinolone  cream (KENALOG ) 0.1 %, Apply 1 Application topically 2 (two) times daily. Limit application in any 1 area to 2 weeks at a time before  taking a break, do not use in any skin folds, genitals, or on the face, Disp: 80 g, Rfl: 0   trandolapril  (MAVIK ) 2 MG tablet, Take 1 tablet (2 mg total) by mouth daily., Disp: 90 tablet, Rfl: 1   Vitamin D , Ergocalciferol , (DRISDOL ) 1.25 MG (50000 UNIT) CAPS capsule, Take 1 capsule (50,000 Units total) by mouth every 7 (seven) days., Disp: 12 capsule, Rfl: 1   Allergies  Allergen Reactions   Penicillins Hives    Has patient had a PCN reaction causing immediate rash, facial/tongue/throat swelling, SOB or lightheadedness with hypotension: Yes Has  patient had a PCN reaction causing severe rash involving mucus membranes or skin necrosis: No Has patient had a PCN reaction that required hospitalization No Has patient had a PCN reaction occurring within the last 10 years: Yes If all of the above answers are NO, then may proceed with Cephalosporin use.   Robaxin  [Methocarbamol ] Rash     Review of Systems  Constitutional: Negative.   HENT: Negative.    Eyes: Negative.   Respiratory: Negative.    Cardiovascular: Negative.  Negative for chest pain, palpitations and leg swelling.  Gastrointestinal: Negative.   Endocrine: Negative.   Genitourinary: Negative.   Musculoskeletal: Negative.   Allergic/Immunologic: Negative.   Neurological: Negative.   Hematological: Negative.   Psychiatric/Behavioral: Negative.       Today's Vitals   05/30/24 1045  BP: 120/80  Pulse: 64  Temp: 98.7 F (37.1 C)  TempSrc: Oral  Weight: 203 lb (92.1 kg)  Height: 5' 9 (1.753 m)  PainSc: 0-No pain   Body mass index is 29.98 kg/m.  Wt Readings from Last 3 Encounters:  05/30/24 203 lb (92.1 kg)  05/13/24 193 lb (87.5 kg)  11/25/23 193 lb 9.6 oz (87.8 kg)    [  Objective:  Physical Exam Vitals reviewed.  Constitutional:      General: He is not in acute distress.    Appearance: Normal appearance.  Cardiovascular:     Rate and Rhythm: Normal rate and regular rhythm.     Pulses: Normal pulses.     Heart sounds: Normal heart sounds. No murmur heard. Pulmonary:     Effort: Pulmonary effort is normal. No respiratory distress.     Breath sounds: Normal breath sounds. No wheezing.  Skin:    General: Skin is warm and dry.     Capillary Refill: Capillary refill takes less than 2 seconds.  Neurological:     General: No focal deficit present.     Mental Status: He is alert and oriented to person, place, and time.     Cranial Nerves: No cranial nerve deficit.  Psychiatric:        Mood and Affect: Mood normal.        Behavior: Behavior normal.         Thought Content: Thought content normal.        Judgment: Judgment normal.     Comments: His mother answers some questions for him      Assessment And Plan:  Essential hypertension Assessment & Plan: Blood pressure is well controlled, continue current medications.   Orders: -     Trandolapril ; Take 1 tablet (2 mg total) by mouth daily.  Dispense: 90 tablet; Refill: 1  Mixed hyperlipidemia Assessment & Plan: Atorvastatin  refilled, ongoing management. - Check cholesterol levels.  Orders: -     Lipid panel -     CMP14+EGFR  Autistic disorder  Bipolar II disorder (HCC) Assessment & Plan: Continue  f/u up with Devere Showman, will fax labs    Encounter for screening -     Hepatitis B surface antibody,qualitative  Vitamin D  deficiency -     Vitamin D  (Ergocalciferol ); Take 1 capsule (50,000 Units total) by mouth every 7 (seven) days.  Dispense: 12 capsule; Refill: 1  Other long term (current) drug therapy -     CBC    Return for keep same next.  Patient was given opportunity to ask questions. Patient verbalized understanding of the plan and was able to repeat key elements of the plan. All questions were answered to their satisfaction.    LILLETTE Gaines Ada, FNP, have reviewed all documentation for this visit. The documentation on 05/30/24 for the exam, diagnosis, procedures, and orders are all accurate and complete.   IF YOU HAVE BEEN REFERRED TO A SPECIALIST, IT MAY TAKE 1-2 WEEKS TO SCHEDULE/PROCESS THE REFERRAL. IF YOU HAVE NOT HEARD FROM US /SPECIALIST IN TWO WEEKS, PLEASE GIVE US  A CALL AT [phone].

## 2024-05-30 NOTE — Assessment & Plan Note (Signed)
 Will check vitamin D  level and supplement as needed.    Also encouraged to spend 15 minutes in the sun daily.

## 2024-05-31 LAB — CBC
Hematocrit: 43.2 % (ref 37.5–51.0)
Hemoglobin: 14.1 g/dL (ref 13.0–17.7)
MCH: 28.7 pg (ref 26.6–33.0)
MCHC: 32.6 g/dL (ref 31.5–35.7)
MCV: 88 fL (ref 79–97)
Platelets: 198 x10E3/uL (ref 150–450)
RBC: 4.92 x10E6/uL (ref 4.14–5.80)
RDW: 13 % (ref 11.6–15.4)
WBC: 4.8 x10E3/uL (ref 3.4–10.8)

## 2024-05-31 LAB — CMP14+EGFR
ALT: 23 IU/L (ref 0–44)
AST: 20 IU/L (ref 0–40)
Albumin: 4.5 g/dL (ref 4.1–5.1)
Alkaline Phosphatase: 61 IU/L (ref 47–123)
BUN/Creatinine Ratio: 8 — ABNORMAL LOW (ref 9–20)
BUN: 8 mg/dL (ref 6–24)
Bilirubin Total: 0.6 mg/dL (ref 0.0–1.2)
CO2: 23 mmol/L (ref 20–29)
Calcium: 9.2 mg/dL (ref 8.7–10.2)
Chloride: 104 mmol/L (ref 96–106)
Creatinine, Ser: 1.04 mg/dL (ref 0.76–1.27)
Globulin, Total: 2.5 g/dL (ref 1.5–4.5)
Glucose: 77 mg/dL (ref 70–99)
Potassium: 4.4 mmol/L (ref 3.5–5.2)
Sodium: 139 mmol/L (ref 134–144)
Total Protein: 7 g/dL (ref 6.0–8.5)
eGFR: 92 mL/min/1.73 (ref >59)

## 2024-05-31 LAB — HEPATITIS B SURFACE ANTIBODY,QUALITATIVE: Hep B Surface Ab, Qual: NONREACTIVE

## 2024-05-31 LAB — LIPID PANEL
Chol/HDL Ratio: 2.5 ratio (ref 0.0–5.0)
Cholesterol, Total: 146 mg/dL (ref 100–199)
HDL: 58 mg/dL (ref >39)
LDL Chol Calc (NIH): 72 mg/dL (ref 0–99)
Triglycerides: 83 mg/dL (ref 0–149)
VLDL Cholesterol Cal: 16 mg/dL (ref 5–40)

## 2024-11-28 ENCOUNTER — Encounter: Payer: MEDICAID | Admitting: Nurse Practitioner
